# Patient Record
Sex: Male | Born: 1955 | Race: Black or African American | Hispanic: No | Marital: Married | Smoking: Former smoker
Health system: Southern US, Community
[De-identification: ages and names within clinical notes are randomized; demographics above are authoritative.]

## PROBLEM LIST (undated history)

## (undated) DIAGNOSIS — J45909 Unspecified asthma, uncomplicated: Secondary | ICD-10-CM

## (undated) DIAGNOSIS — C61 Malignant neoplasm of prostate: Secondary | ICD-10-CM

## (undated) DIAGNOSIS — N401 Enlarged prostate with lower urinary tract symptoms: Secondary | ICD-10-CM

## (undated) DIAGNOSIS — N529 Male erectile dysfunction, unspecified: Secondary | ICD-10-CM

## (undated) DIAGNOSIS — E7800 Pure hypercholesterolemia, unspecified: Secondary | ICD-10-CM

## (undated) DIAGNOSIS — J439 Emphysema, unspecified: Secondary | ICD-10-CM

## (undated) HISTORY — PX: COLONOSCOPY: SHX174

## (undated) HISTORY — PX: PROSTATE BIOPSY: SHX241

## (undated) HISTORY — PX: OTHER SURGICAL HISTORY: SHX169

---

## 2011-07-20 ENCOUNTER — Ambulatory Visit (HOSPITAL_BASED_OUTPATIENT_CLINIC_OR_DEPARTMENT_OTHER)
Admission: EM | Admit: 2011-07-20 | Discharge: 2011-07-21 | Disposition: A | Discharge status: Home / Self Care | Payer: Worker's Compensation | Attending: Emergency Medicine | Admitting: Emergency Medicine

## 2011-07-20 ENCOUNTER — Emergency Department (INDEPENDENT_AMBULATORY_CARE_PROVIDER_SITE_OTHER): Payer: Worker's Compensation

## 2011-07-20 ENCOUNTER — Encounter: Payer: Self-pay | Admitting: *Deleted

## 2011-07-20 DIAGNOSIS — X58XXXA Exposure to other specified factors, initial encounter: Secondary | ICD-10-CM | POA: Insufficient documentation

## 2011-07-20 DIAGNOSIS — S62233B Other displaced fracture of base of first metacarpal bone, unspecified hand, initial encounter for open fracture: Secondary | ICD-10-CM

## 2011-07-20 DIAGNOSIS — Y9269 Other specified industrial and construction area as the place of occurrence of the external cause: Secondary | ICD-10-CM | POA: Insufficient documentation

## 2011-07-20 DIAGNOSIS — M25549 Pain in joints of unspecified hand: Secondary | ICD-10-CM

## 2011-07-20 DIAGNOSIS — Y990 Civilian activity done for income or pay: Secondary | ICD-10-CM | POA: Insufficient documentation

## 2011-07-20 DIAGNOSIS — S62209B Unspecified fracture of first metacarpal bone, unspecified hand, initial encounter for open fracture: Secondary | ICD-10-CM

## 2011-07-20 DIAGNOSIS — Z23 Encounter for immunization: Secondary | ICD-10-CM | POA: Insufficient documentation

## 2011-07-20 DIAGNOSIS — M7989 Other specified soft tissue disorders: Secondary | ICD-10-CM

## 2011-07-20 DIAGNOSIS — S61209A Unspecified open wound of unspecified finger without damage to nail, initial encounter: Secondary | ICD-10-CM | POA: Insufficient documentation

## 2011-07-20 MED ORDER — TETANUS-DIPHTH-ACELL PERTUSSIS 5-2.5-18.5 LF-MCG/0.5 IM SUSP
0.5000 mL | Freq: Once | INTRAMUSCULAR | Status: AC
  Administered 2011-07-20: 0.5 mL via INTRAMUSCULAR
  Filled 2011-07-20: qty 0.5

## 2011-07-20 MED ORDER — CEFAZOLIN SODIUM 1-5 GM-% IV SOLN
1.0000 g | Freq: Once | INTRAVENOUS | Status: AC
  Administered 2011-07-20: 1 g via INTRAVENOUS
  Filled 2011-07-20: qty 50

## 2011-07-20 MED ORDER — MORPHINE SULFATE 2 MG/ML IJ SOLN
INTRAMUSCULAR | Status: AC
  Filled 2011-07-20: qty 1

## 2011-07-20 MED ORDER — MORPHINE SULFATE 4 MG/ML IJ SOLN
6.0000 mg | Freq: Once | INTRAMUSCULAR | Status: AC
  Administered 2011-07-20: 6 mg via INTRAVENOUS
  Filled 2011-07-20: qty 1

## 2011-07-20 NOTE — ED Notes (Signed)
Pt sent here from [facility] for further eval, left hand injury at work with laceration.

## 2011-07-20 NOTE — ED Provider Notes (Signed)
History     CSN: [id number] Arrival date & time: 07/20/2011  6:30 PM   First MD Initiated Contact with Patient 07/20/11 1832      Chief Complaint  Patient presents with  . Hand Injury    (Consider location/radiation/quality/duration/timing/severity/associated sxs/prior treatment) HPI Patient was at work this evening when he had a loading dock ramp dropped on his left hand, lacerating the area between his thumb and second digit.  He went to [facility] where he was advised that he would need to come to the emergency room and have orthopedic hand surgeon consult.  Patient has range of motion of the thumb , but there is some issue with full range of motion.  There is some extensive bleeding to the wound appears to be muscle exposed.  Patient states there is some mild numbness of the digit. History reviewed. No pertinent past medical history.  History reviewed. No pertinent past surgical history.  History reviewed. No pertinent family history.  History  Substance Use Topics  . Smoking status: Current Everyday Smoker -- 0.5 packs/day  . Smokeless tobacco: Not on file  . Alcohol Use: No      Review of Systems All pertinent positives and negatives are reviewed in the history of present illness. Allergies  Review of patient's allergies indicates no known allergies.  Home Medications   Current Outpatient Rx  Name Route Sig Dispense Refill  . NAPHAZOLINE HCL 0.012 % OP SOLN Both Eyes Place 1-2 drops into both eyes 2 (two) times daily as needed. For eye irritation       BP 137/77  Pulse 68  Temp 97.7 F (36.5 C)  Resp 16  Ht 5\' 7"  (1.702 m)  Wt 170 lb (77.111 kg)  BMI 26.63 kg/m2  SpO2 96%  Physical Exam  Constitutional: He is oriented to person, place, and time. He appears well-developed and well-nourished.  HENT:  Head: Normocephalic and atraumatic.  Cardiovascular: Normal rate, regular rhythm and normal heart sounds.   Pulmonary/Chest: Effort normal and breath sounds  normal.  Musculoskeletal:       Hands: Neurological: He is alert and oriented to person, place, and time.    ED Course  Procedures (including critical care time)  Labs Reviewed - No data to display Dg Hand Complete Left  07/20/2011  *RADIOLOGY REPORT*  Clinical Data: Blunt force injury, pain to thumb.  LEFT HAND - COMPLETE 3+ VIEW  Comparison: None.  Findings: Fracture dislocation, mildly comminuted, at the base of the first metacarpal.  Soft tissue swelling.  Air is seen in the soft tissues. First Carpometacarpal articulation grossly intact.  IMPRESSION: Mildly comminuted fracture dislocation at the base of the first metacarpal.  Soft tissue swelling.  Air in the soft tissues.  Original Report Authenticated By: Elsie Stain, M.D.     No diagnosis found.  I spoke with Dr. Betha Loa about the patient and he advised to have him come to the [facility] long emergency department and remained n.p.o.Marland Kitchen patient is being given 1 g of Ancef and pain control  MDM  Open fracture with muscle exposure noted to be left thenar prominence, and into the web space.          Jamesetta Orleans [place], [place] 07/20/11 2026

## 2011-07-20 NOTE — ED Provider Notes (Signed)
Medical screening examination/treatment/procedure(s) were performed by non-physician practitioner and as supervising physician I was immediately available for consultation/collaboration.  Toy Baker, MD 07/20/11 [phone]

## 2011-07-20 NOTE — ED Notes (Signed)
Report received on pt from [facility].

## 2011-07-21 ENCOUNTER — Encounter (HOSPITAL_COMMUNITY)
Admission: EM | Disposition: A | Discharge status: Home / Self Care | Payer: Self-pay | Source: Home / Self Care | Attending: Emergency Medicine

## 2011-07-21 ENCOUNTER — Encounter (HOSPITAL_COMMUNITY): Payer: Self-pay | Admitting: Anesthesiology

## 2011-07-21 ENCOUNTER — Emergency Department (HOSPITAL_COMMUNITY): Payer: Worker's Compensation | Admitting: Anesthesiology

## 2011-07-21 ENCOUNTER — Encounter (HOSPITAL_COMMUNITY): Payer: Self-pay | Admitting: Orthopedic Surgery

## 2011-07-21 HISTORY — PX: ORIF FINGER FRACTURE: SHX2122

## 2011-07-21 SURGERY — OPEN REDUCTION INTERNAL FIXATION (ORIF) METACARPAL (FINGER) FRACTURE
Anesthesia: General | Site: Hand | Laterality: Left | Wound class: Dirty or Infected

## 2011-07-21 MED ORDER — SULFAMETHOXAZOLE-TRIMETHOPRIM 800-160 MG PO TABS: 1.0000 | ORAL_TABLET | Freq: Two times a day (BID) | ORAL | Status: AC

## 2011-07-21 MED ORDER — SULFAMETHOXAZOLE-TRIMETHOPRIM 800-160 MG PO TABS: 1.0000 | ORAL_TABLET | Freq: Two times a day (BID) | ORAL | Status: DC

## 2011-07-21 MED ORDER — PROPOFOL 10 MG/ML IV EMUL
INTRAVENOUS | Status: DC | PRN
  Administered 2011-07-21: 140 mg via INTRAVENOUS

## 2011-07-21 MED ORDER — BUPIVACAINE HCL (PF) 0.25 % IJ SOLN
INTRAMUSCULAR | Status: DC | PRN
  Administered 2011-07-21: 10 mL

## 2011-07-21 MED ORDER — MIDAZOLAM HCL 5 MG/5ML IJ SOLN
INTRAMUSCULAR | Status: DC | PRN
  Administered 2011-07-21: 2 mg via INTRAVENOUS

## 2011-07-21 MED ORDER — PROMETHAZINE HCL 25 MG/ML IJ SOLN: 6.2500 mg | INTRAMUSCULAR | Status: DC | PRN

## 2011-07-21 MED ORDER — CEFAZOLIN SODIUM 1-5 GM-% IV SOLN
1.0000 g | Freq: Once | INTRAVENOUS | Status: AC
  Administered 2011-07-21: 1 g via INTRAVENOUS

## 2011-07-21 MED ORDER — SUCCINYLCHOLINE CHLORIDE 20 MG/ML IJ SOLN
INTRAMUSCULAR | Status: DC | PRN
  Administered 2011-07-21: 100 mg via INTRAVENOUS

## 2011-07-21 MED ORDER — DEXAMETHASONE SODIUM PHOSPHATE 4 MG/ML IJ SOLN
INTRAMUSCULAR | Status: DC | PRN
  Administered 2011-07-21: 4 mg via INTRAVENOUS

## 2011-07-21 MED ORDER — OXYCODONE-ACETAMINOPHEN 5-325 MG PO TABS: ORAL_TABLET | ORAL | Status: DC

## 2011-07-21 MED ORDER — MORPHINE SULFATE 2 MG/ML IJ SOLN
2.0000 mg | Freq: Once | INTRAMUSCULAR | Status: AC
  Administered 2011-07-21: 2 mg via INTRAVENOUS
  Filled 2011-07-21: qty 1

## 2011-07-21 MED ORDER — SUFENTANIL CITRATE 50 MCG/ML IV SOLN
INTRAVENOUS | Status: DC | PRN
  Administered 2011-07-21: 20 ug via INTRAVENOUS
  Administered 2011-07-21: 10 ug via INTRAVENOUS

## 2011-07-21 MED ORDER — LACTATED RINGERS IV SOLN
INTRAVENOUS | Status: DC | PRN
  Administered 2011-07-21 (×2): via INTRAVENOUS

## 2011-07-21 MED ORDER — OXYCODONE-ACETAMINOPHEN 5-325 MG PO TABS: ORAL_TABLET | ORAL | Status: AC

## 2011-07-21 MED ORDER — ONDANSETRON HCL 4 MG/2ML IJ SOLN
INTRAMUSCULAR | Status: DC | PRN
  Administered 2011-07-21: 4 mg via INTRAVENOUS

## 2011-07-21 MED ORDER — GLYCOPYRROLATE 0.2 MG/ML IJ SOLN
INTRAMUSCULAR | Status: DC | PRN
  Administered 2011-07-21: 0.2 mg via INTRAVENOUS

## 2011-07-21 MED ORDER — HYDROMORPHONE HCL PF 1 MG/ML IJ SOLN: 0.2500 mg | INTRAMUSCULAR | Status: DC | PRN

## 2011-07-21 SURGICAL SUPPLY — 43 items
BANDAGE ELASTIC 4 VELCRO ST LF (GAUZE/BANDAGES/DRESSINGS) ×2 IMPLANT
BANDAGE GAUZE ELAST BULKY 4 IN (GAUZE/BANDAGES/DRESSINGS) IMPLANT
BNDG ESMARK 4X9 LF (GAUZE/BANDAGES/DRESSINGS) ×2 IMPLANT
CLOTH BEACON ORANGE TIMEOUT ST (SAFETY) ×2 IMPLANT
CORDS BIPOLAR (ELECTRODE) ×2 IMPLANT
DRAPE SURG 17X23 STRL (DRAPES) ×2 IMPLANT
DRSG ADAPTIC 3X8 NADH LF (GAUZE/BANDAGES/DRESSINGS) IMPLANT
DRSG EMULSION OIL 3X3 NADH (GAUZE/BANDAGES/DRESSINGS) IMPLANT
DRSG PAD ABDOMINAL 8X10 ST (GAUZE/BANDAGES/DRESSINGS) IMPLANT
GAUZE KERLIX 2  STERILE LF (GAUZE/BANDAGES/DRESSINGS) ×2 IMPLANT
GAUZE XEROFORM 1X8 LF (GAUZE/BANDAGES/DRESSINGS) ×2 IMPLANT
GLOVE BIO SURGEON STRL SZ7.5 (GLOVE) ×2 IMPLANT
GLOVE BIOGEL PI IND STRL 8 (GLOVE) ×1 IMPLANT
GLOVE BIOGEL PI INDICATOR 8 (GLOVE) ×1
GOWN STRL REIN XL XLG (GOWN DISPOSABLE) ×2 IMPLANT
HANDPIECE INTERPULSE COAX TIP (DISPOSABLE)
K-WIRE .045 ×6 IMPLANT
KIT BASIN OR (CUSTOM PROCEDURE TRAY) ×2 IMPLANT
KIT ROOM TURNOVER OR (KITS) ×2 IMPLANT
MANIFOLD NEPTUNE II (INSTRUMENTS) ×2 IMPLANT
NEEDLE HYPO 25GX1X1/2 BEV (NEEDLE) ×2 IMPLANT
NS IRRIG 1000ML POUR BTL (IV SOLUTION) ×2 IMPLANT
PACK ORTHO EXTREMITY (CUSTOM PROCEDURE TRAY) ×2 IMPLANT
PAD ARMBOARD 7.5X6 YLW CONV (MISCELLANEOUS) ×4 IMPLANT
PADDING WEBRIL 4 STERILE (GAUZE/BANDAGES/DRESSINGS) ×2 IMPLANT
SET HNDPC FAN SPRY TIP SCT (DISPOSABLE) IMPLANT
SPLINT PLASTER 3X15 (CAST SUPPLIES) ×2 IMPLANT
SPONGE GAUZE 4X4 12PLY (GAUZE/BANDAGES/DRESSINGS) ×2 IMPLANT
SPONGE LAP 18X18 X RAY DECT (DISPOSABLE) IMPLANT
SPONGE LAP 4X18 X RAY DECT (DISPOSABLE) ×2 IMPLANT
SUT ETHILON 4 0 PS 2 18 (SUTURE) ×4 IMPLANT
SUT VIC AB 3-0 SH 27 (SUTURE) ×1
SUT VIC AB 3-0 SH 27X BRD (SUTURE) ×1 IMPLANT
SWAB CULTURE LIQ STUART DBL (MISCELLANEOUS) IMPLANT
SYR CONTROL 10ML LL (SYRINGE) ×2 IMPLANT
TOWEL OR 17X24 6PK STRL BLUE (TOWEL DISPOSABLE) ×2 IMPLANT
TOWEL OR 17X26 10 PK STRL BLUE (TOWEL DISPOSABLE) ×2 IMPLANT
TUBE ANAEROBIC SPECIMEN COL (MISCELLANEOUS) IMPLANT
TUBE CONNECTING 12X1/4 (SUCTIONS) ×2 IMPLANT
TUBE FEEDING 5FR 15 INCH (TUBING) IMPLANT
UNDERPAD 30X30 INCONTINENT (UNDERPADS AND DIAPERS) ×2 IMPLANT
WATER STERILE IRR 1000ML POUR (IV SOLUTION) ×2 IMPLANT
YANKAUER SUCT BULB TIP NO VENT (SUCTIONS) IMPLANT

## 2011-07-21 NOTE — ED Notes (Signed)
Admitting MD in to speak with pt at this time.  

## 2011-07-21 NOTE — Op Note (Signed)
Dictation [id number]

## 2011-07-21 NOTE — Anesthesia Postprocedure Evaluation (Signed)
  Anesthesia Post-op Note  Patient: James Gutierrez  Procedure(s) Performed:  OPEN REDUCTION INTERNAL FIXATION (ORIF) METACARPAL (FINGER) FRACTURE - left thumb laceration   Patient Location: PACU  Anesthesia Type: General  Level of Consciousness: awake, alert , oriented and patient cooperative  Airway and Oxygen Therapy: Patient Spontanous Breathing and Patient connected to nasal cannula oxygen  Post-op Pain: none  Post-op Assessment: Post-op Vital signs reviewed, Patient's Cardiovascular Status Stable, Respiratory Function Stable, Patent Airway, No signs of Nausea or vomiting and Pain level controlled  Post-op Vital Signs: Reviewed and stable  Complications: No apparent anesthesia complications

## 2011-07-21 NOTE — ED Provider Notes (Cosign Needed)
Patient transferred from [place] to see Dr. Merlyn Lot for evaluation of hand injury.  Patient resting comfortably at present.  Dr. Merlyn Lot  notified of patient's arrival, is currently at bedside.  Jimmye Norman, NP 07/21/11 [phone]

## 2011-07-21 NOTE — Anesthesia Postprocedure Evaluation (Signed)
  Anesthesia Post-op Note  Patient: James Gutierrez  Procedure(s) Performed:  OPEN REDUCTION INTERNAL FIXATION (ORIF) METACARPAL (FINGER) FRACTURE - left thumb laceration   Patient Location: PACU  Anesthesia Type: General  Level of Consciousness: awake, alert  and oriented  Airway and Oxygen Therapy: Patient Spontanous Breathing  Post-op Pain: mild  Post-op Assessment: Post-op Vital signs reviewed, Patient's Cardiovascular Status Stable, Respiratory Function Stable, Patent Airway, No signs of Nausea or vomiting and Pain level controlled   Post-op Vital Signs: stable  Complications: No apparent anesthesia complications

## 2011-07-21 NOTE — Transfer of Care (Signed)
Immediate Anesthesia Transfer of Care Note  Patient: James Gutierrez  Procedure(s) Performed:  OPEN REDUCTION INTERNAL FIXATION (ORIF) METACARPAL (FINGER) FRACTURE - left thumb laceration   Patient Location: PACU  Anesthesia Type: General  Level of Consciousness: awake, sedated and patient cooperative  Airway & Oxygen Therapy: Patient Spontanous Breathing and Patient connected to nasal cannula oxygen  Post-op Assessment: Report given to PACU RN, Post -op Vital signs reviewed and stable and Patient moving all extremities  Post vital signs: Reviewed and stable  Complications: No apparent anesthesia complications

## 2011-07-21 NOTE — Brief Op Note (Signed)
07/20/2011 - 07/21/2011  4:22 AM  PATIENT:  James Gutierrez  55 y.o. male  PRE-OPERATIVE DIAGNOSIS:  fracture and laceration of left thumb  POST-OPERATIVE DIAGNOSIS:  fracture and laceration of left thumb  PROCEDURE:  Procedure(s): OPEN REDUCTION INTERNAL FIXATION (ORIF) METACARPAL (FINGER) FRACTURE  SURGEON:  Surgeon(s): Tami Ribas  PHYSICIAN ASSISTANT:   ASSISTANTS: none   ANESTHESIA:   general  EBL:  Total I/O In: 1000 [I.V.:1000] Out: -   BLOOD ADMINISTERED:none  DRAINS: none   LOCAL MEDICATIONS USED:  MARCAINE 10CC  SPECIMEN:  No Specimen  DISPOSITION OF SPECIMEN:  N/A  COUNTS:  YES  TOURNIQUET:   Total Tourniquet Time Documented: Upper Arm (Left) - 105 minutes  DICTATION: .Other Dictation: Dictation Number [id number]  PLAN OF CARE: Discharge to home after PACU  PATIENT DISPOSITION:  PACU - hemodynamically stable.

## 2011-07-21 NOTE — H&P (Signed)
James Gutierrez is an 55 y.o. male.   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  He suffered a laceration in the thumb index webspace.  He was taken to [facility] where radiographs revealed a thumb metacarpal base fracture.  He reports no previous injuries to the left hand and no other injuries at this time with the exception of a sore great toe.  History reviewed. No pertinent past medical history.  History reviewed. No pertinent past surgical history.  History reviewed. No pertinent family history. Social History:  reports that he has been smoking.  He does not have any smokeless tobacco history on file. He reports that he does not drink alcohol or use illicit drugs.  Allergies: No Known Allergies  Medications Prior to Admission  Medication Dose Route Frequency Provider Last Rate Last Dose  . ceFAZolin (ANCEF) IVPB 1 g/50 mL premix  1 g Intravenous Once Jamesetta Orleans [place], PA   1 g at 07/20/11 2004  . morphine 2 MG/ML injection 2 mg  2 mg Intravenous Once Tami Ribas   2 mg at 07/21/11 0030  . morphine 2 MG/ML injection           . morphine 4 MG/ML injection 6 mg  6 mg Intravenous Once Carlyle Dolly, PA   6 mg at 07/20/11 2023  . TDaP (BOOSTRIX) injection 0.5 mL  0.5 mL Intramuscular Once Carlyle Dolly, PA   0.5 mL at 07/20/11 2002   No current outpatient prescriptions on file as of 07/20/2011.    No results found for this or any previous visit (from the past 48 hour(s)).  Dg Hand Complete Left  07/20/2011  *RADIOLOGY REPORT*  Clinical Data: Blunt force injury, pain to thumb.  LEFT HAND - COMPLETE 3+ VIEW  Comparison: None.  Findings: Fracture dislocation, mildly comminuted, at the base of the first metacarpal.  Soft tissue swelling.  Air is seen in the soft tissues. First Carpometacarpal articulation grossly intact.  IMPRESSION: Mildly comminuted  fracture dislocation at the base of the first metacarpal.  Soft tissue swelling.  Air in the soft tissues.  Original Report Authenticated By: Elsie Stain, M.D.     A comprehensive review of systems was negative.  Blood pressure 131/77, pulse 82, temperature 98.5 F (36.9 C), temperature source Oral, resp. rate 20, height 5\' 7"  (1.702 m), weight 77.111 kg (170 lb), SpO2 100.00%.  General appearance: alert, cooperative, appears stated age and no distress Head: Normocephalic, without obvious abnormality, atraumatic Neck: supple, symmetrical, trachea midline Resp: clear to auscultation bilaterally Cardio: regular rate and rhythm GI: soft, non-tender; bowel sounds normal; no masses,  no organomegaly Extremities: right upper extremity ltsi, +epl/fpl/io, + cap refill.  left upper extremity: intact to light touch sensation and capillary refill in all fingertips.  +epl/fpl/io.  there is a wound in the distal aspect of the thumb/index webspace with lacerated muscle protruding.  He has inability to adduct the thumb to the index finger. No gross contamination.  There is normal sensation in the thumb and brisk capillary refill.    Pulses: 2+ and symmetric Skin: Skin color, texture, turgor normal. No rashes or lesions or wound as above Neurologic: Grossly normal Incision/Wound: Wound as above.  Assessment/Plan Left thumb metacarpal base fracture and thumb/index webspace laceration.  Recommend OR for I&D, possible muscle repair, percutaneous pinning of metacarpal fracture.  Risks, benefits, alternatives discussed with patient and wife and they agree with plan of care.  Siddiq Kaluzny R 07/21/2011, 12:42 AM

## 2011-07-21 NOTE — Anesthesia Preprocedure Evaluation (Addendum)
Anesthesia Evaluation  Patient identified by MRN, date of birth, ID band Patient awake    Reviewed: Allergy & Precautions, H&P , NPO status , Patient's Chart, lab work & pertinent test results  History of Anesthesia Complications Negative for: history of anesthetic complications  Airway       Dental  (+) Poor Dentition and Dental Advisory Given   Pulmonary Current Smoker,          Cardiovascular     Neuro/Psych Negative Neurological ROS  Negative Psych ROS   GI/Hepatic negative GI ROS, Neg liver ROS,   Endo/Other  Negative Endocrine ROS  Renal/GU negative Renal ROS     Musculoskeletal negative musculoskeletal ROS (+)   Abdominal   Peds  Hematology negative hematology ROS (+)   Anesthesia Other Findings   Reproductive/Obstetrics                          Anesthesia Physical Anesthesia Plan  ASA: II and Emergent  Anesthesia Plan: General   Post-op Pain Management:    Induction: Intravenous and Rapid sequence  Airway Management Planned: Oral ETT  Additional Equipment:   Intra-op Plan:   Post-operative Plan: Extubation in OR  Informed Consent: I have reviewed the patients History and Physical, chart, labs and discussed the procedure including the risks, benefits and alternatives for the proposed anesthesia with the patient or authorized representative who has indicated his/her understanding and acceptance.   Dental advisory given  Plan Discussed with: Surgeon and CRNA  Anesthesia Plan Comments:        Anesthesia Quick Evaluation

## 2011-07-21 NOTE — Progress Notes (Signed)
Pt d/c home via wheelchair with wife

## 2011-07-21 NOTE — Anesthesia Postprocedure Evaluation (Signed)
  Anesthesia Post-op Note  Patient: James Gutierrez  Procedure(s) Performed:  OPEN REDUCTION INTERNAL FIXATION (ORIF) METACARPAL (FINGER) FRACTURE - left thumb laceration   Patient Location: PACU  Anesthesia Type: General  Level of Consciousness: awake, oriented and patient cooperative  Airway and Oxygen Therapy: Patient Spontanous Breathing and Patient connected to nasal cannula oxygen  Post-op Pain: none  Post-op Assessment: Post-op Vital signs reviewed, Patient's Cardiovascular Status Stable, Respiratory Function Stable, Patent Airway, No signs of Nausea or vomiting and Pain level controlled  Post-op Vital Signs: Reviewed and stable  Complications: No apparent anesthesia complications

## 2011-07-21 NOTE — Op Note (Signed)
NAME:  XZAIVER, VAYDA                  ACCOUNT NO.:  MEDICAL RECORD NO.:  [id number]  LOCATION:                                 FACILITY:  PHYSICIAN:  Betha Loa, MD        DATE OF BIRTH:  Sep 14, 1955  DATE OF PROCEDURE:  07/21/2011 DATE OF DISCHARGE:                              OPERATIVE REPORT   PREOPERATIVE DIAGNOSIS:  Left thumb metacarpal base fracture and thumb index webspace laceration with muscle injury.  POSTOPERATIVE DIAGNOSIS:  Left thumb metacarpal base fracture, CMC dislocation, and thumb index webspace laceration with muscle injury.  PROCEDURE:  Irrigation and debridement of open fracture, exploration of traumatic wound, and open reduction percutaneous pinning of left thumb epibasal fracture and CMC dislocation.  SURGEON:  Betha Loa, MD  ASSISTANTS:  None.  ANESTHESIA:  General.  IV FLUIDS:  Per anesthesia flow sheet.  ESTIMATED BLOOD LOSS:  Minimal.  COMPLICATIONS:  None.  SPECIMENS:  None.  TOURNIQUET TIME:  105 minutes.  DISPOSITION:  Stable to PACU.  INDICATIONS:  Mr. Bynum is a 55 year old male who was at work yesterday in the early evening when some type of steel door or lift hit his left hand.  This caused injury and laceration to the hand.  He was taken to [facility] where radiographs were taken revealing epi- basal fracture of the metacarpal of the left thumb.  He was referred to me for further care.  He was transferred to [facility] for my evaluation.  On evaluation, he had intact sensation and good capillary refill in all fingertips.  He could flex, extend the IP joint of his thumb slightly and could wiggle all of his digits.  There was laceration in the thumb index webspace along the border of the web. On radiographs, he had an epi-basal fracture with 100% displacement of the fracture.  I discussed with Mr. Rahming and his wife the nature of the injury.  I recommended going to the operating room for  irrigation and debridement of the wounds, pinning of the fracture, and potential repair of muscle or other associated injuries.  Risks, benefits, alternatives of surgery were discussed including the risk of blood loss, infection, damage to nerves, vessels, tendons, ligaments, bone, failure to surgery, need for additional surgery, complications with wound healing, continued pain, nonunion, malunion, stiffness.  He wished understanding of these risks and elected to proceed.  OPERATIVE COURSE:  After being identified preoperatively by myself, the Patient & I agreed upon procedure and site procedure.  The surgical site was marked.  The risks, benefits, and alternatives of surgery were reviewed and he wished to proceed.  Surgical consent had been signed.  His Ancef was redosed.  He was transferred to the operating room and placed on the operating room table in supine position with left upper extremity on arm board.  General esthesia was induced by the anesthesiologist. Left upper extremity was prepped and draped in normal sterile orthopedic fashion.  A surgical pause was performed between surgeons, anesthesia, operating staff, and all were in agreement with the patient, procedure, and site of procedure.  Tourniquet at the proximal aspect of the extremity  was inflated to 250 mmHg after exsanguination of limb with an Esmarch bandage.  His wound was copiously irrigated with 500 mL of sterile saline.  There was no gross contamination.  It was extended on the dorsal side of the hand.  The wound was explored.  There was an arterial branch it was lacerated, but seemed to be an accessory branch.  The digital artery to the ulnar side of the thumb and the digital artery to the radial side of the index finger were identified and were intact. The ulnar digital nerve to the thumb and radial digital nerve to the index finger were identified and were intact as well.  There was significant muscle tearing of  the thumb adductor muscle.  The fracture could be palpated through his wound.  There was also small poke wound at the dorsal aspect of the thumb.  The wound was again copiously irrigated with sterile saline.  A single 3-0 Vicryl stitch was able to be placed in the muscle to re-oppose it.  The fracture was able to be reduced by direct manipulation through the wound.  A 0.045-inch K-wire was then advanced from the shaft of the thumb metacarpal across the fracture site into the trapezium.  The [facility] joint was noted to be unstable and would significantly subluxate.  This was reduced and secured with the pin as well.  An additional 0.045-inch K-wire was advanced through the base of the thumb fracture into the index metacarpal.  An additional 0.045-inch K-wire was advanced distal to the fracture site through the metacarpal of the thumb and into the metacarpal of the index finger.  This stabilized the fracture/dislocation very well.  The C-arm was used in AP, lateral, and oblique projections throughout the case to aid in reduction and positioning of the hardware.  Excellent reduction was obtained.  The wounds were again irrigated.  They were closed with 4-0 nylon in a horizontal mattress and interrupted fashion.  The fracture site and wounds were injected with 10 mL of 0.25% plain Marcaine to aid in postoperative analgesia.  The wounds were dressed with sterile Xeroform and 4x4s.  The pins were bent and cut short.  The hand was wrapped with a Kerlix bandage.  A volar splint and thumb spica splint were placed and wrapped with Kerlix and Ace bandage.  The tourniquet was deflated at 105 minutes.  The fingertips were all pink with brisk capillary refill after deflation of the tourniquet.  The operative drapes were broken down and the patient was awoken from anesthesia safely.  He was transferred back to stretcher and taken to PACU in stable condition.  I will see him back in the office in 1 week  for postoperative followup.  I will give him Percocet 5/325 one to two p.o. q.6 hours p.r.n. pain, dispensed #50 and Bactrim DS 1 p.o. b.i.d. x7 days.     Betha Loa, MD     KK/MEDQ  D:  07/21/2011  T:  07/21/2011  Job:  [id number]

## 2011-07-21 NOTE — Addendum Note (Signed)
Addendum  created 07/21/11 0607 by Nel Stoneking, MD   Modules edited:Anesthesia Attestations    

## 2011-07-21 NOTE — Addendum Note (Signed)
Addendum  created 07/21/11 0553 by Bedelia Person, MD   Modules edited:Anesthesia Attestations, Notes Section

## 2011-07-25 ENCOUNTER — Encounter (HOSPITAL_COMMUNITY): Payer: Self-pay | Admitting: Orthopedic Surgery

## 2012-02-28 IMAGING — CR DG HAND COMPLETE 3+V*L*
3 series · 3 of 3 positions shown · non-contrast
Comparison: None.

CLINICAL DATA: Blunt force injury, pain to thumb.

LEFT HAND - COMPLETE 3+ VIEW

[x hand pa left]
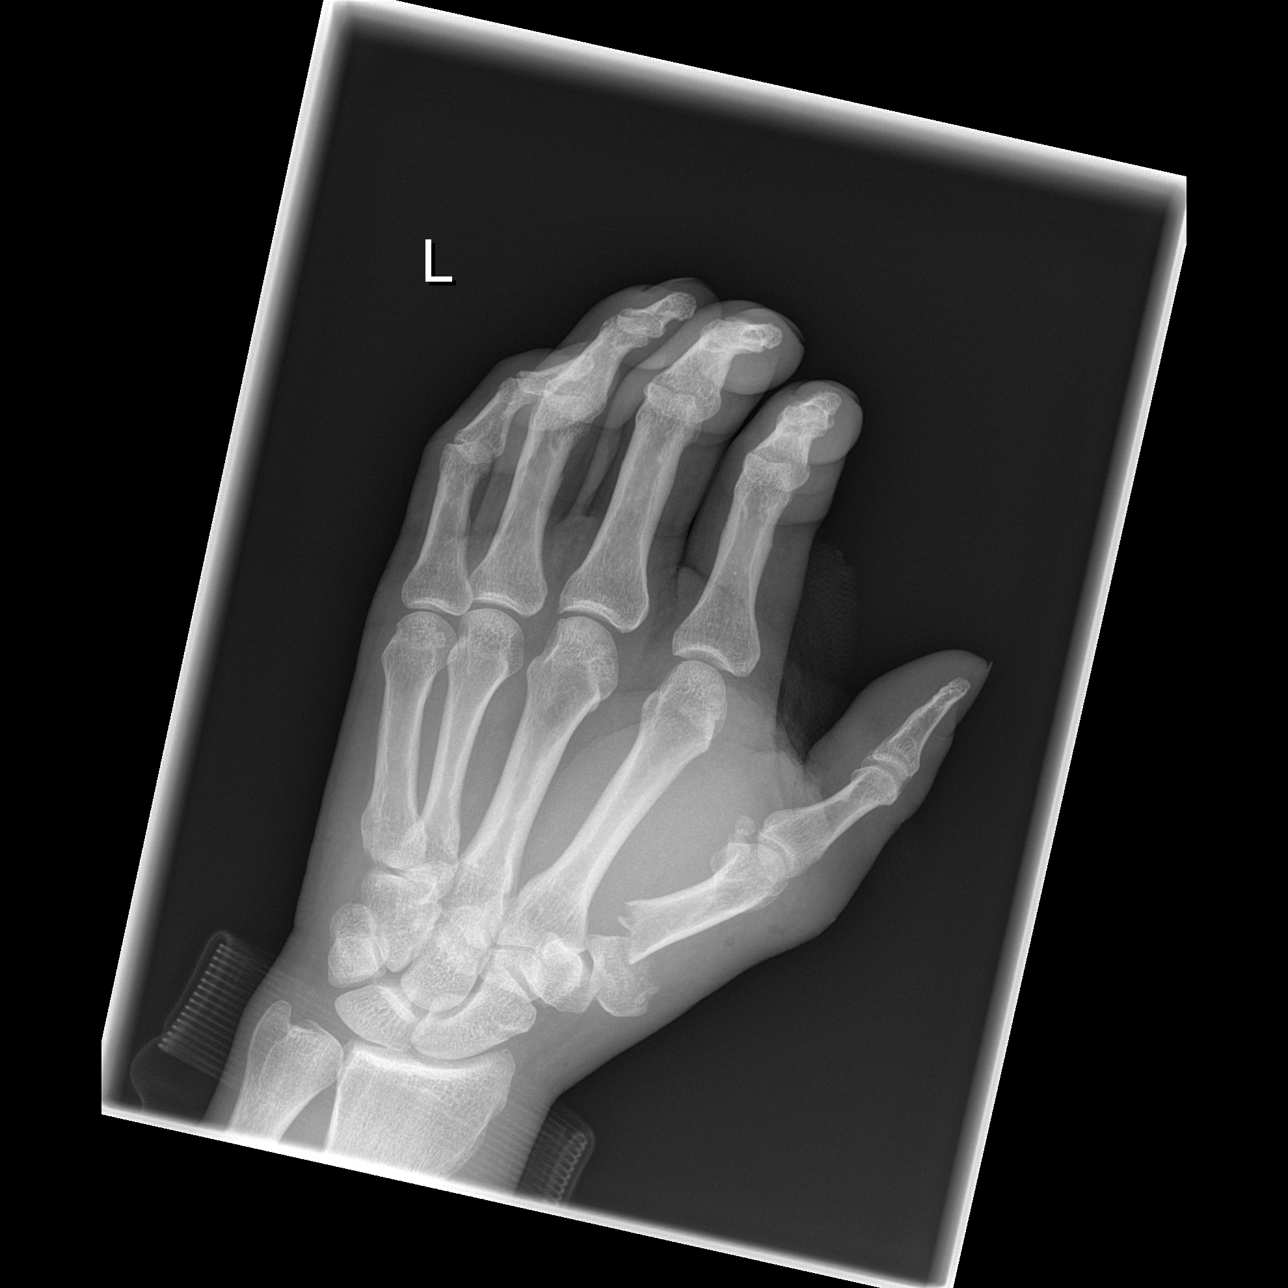

[x hand oblique left]
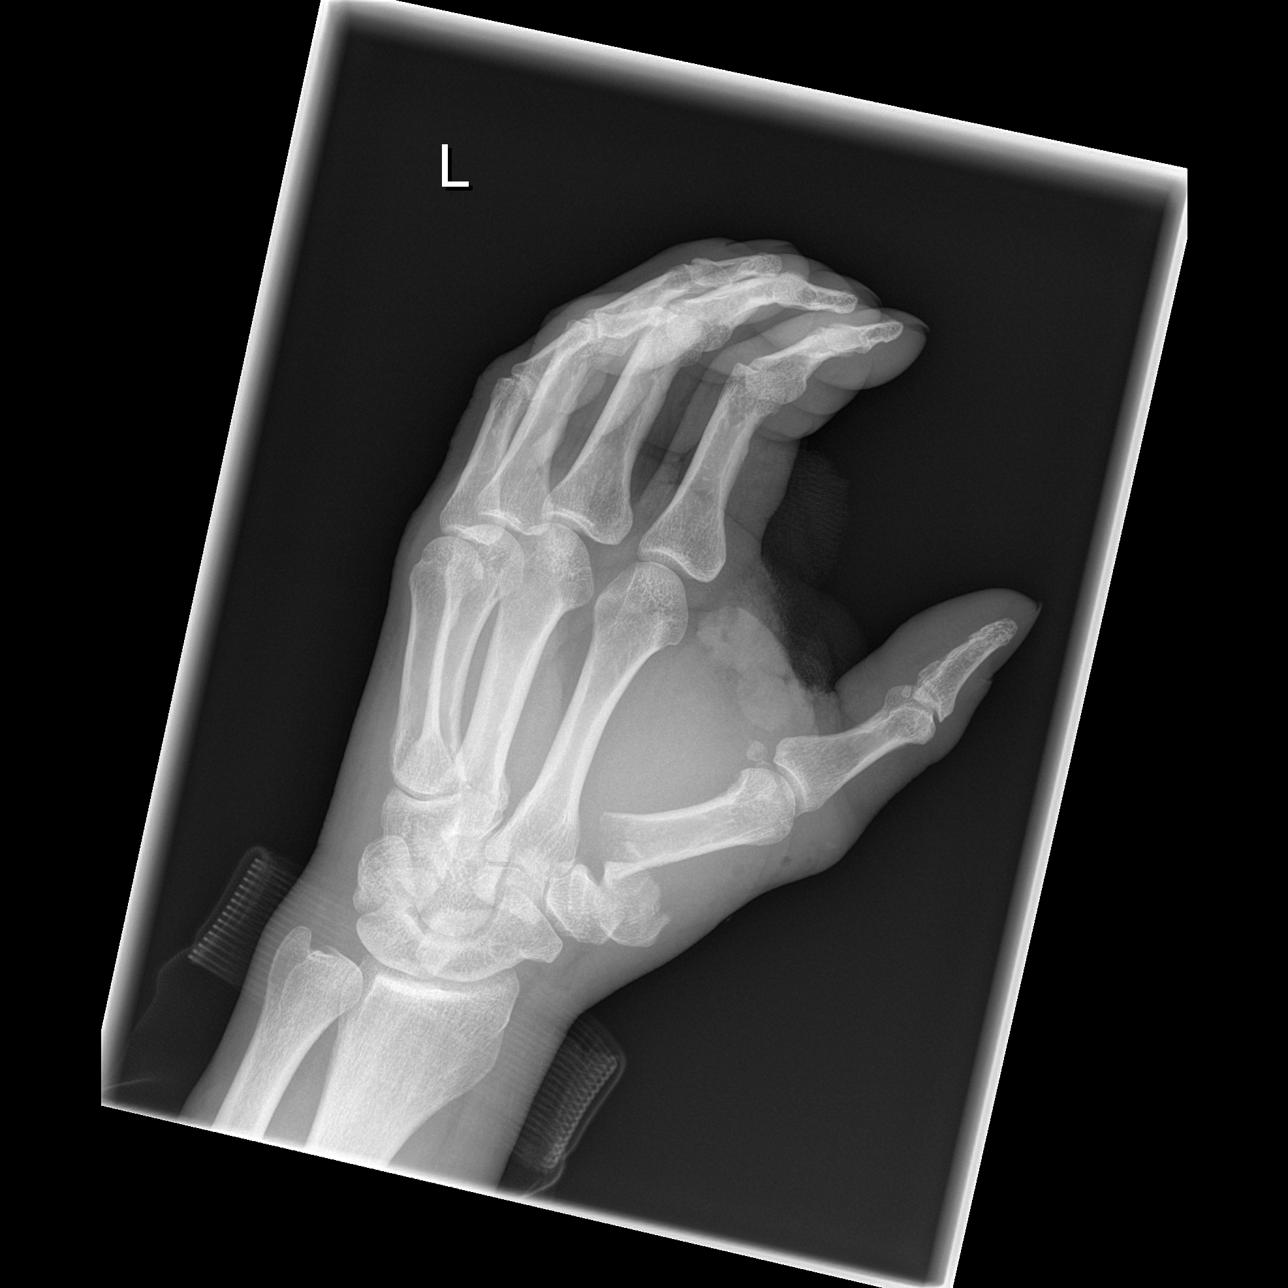

[x hand lat left]
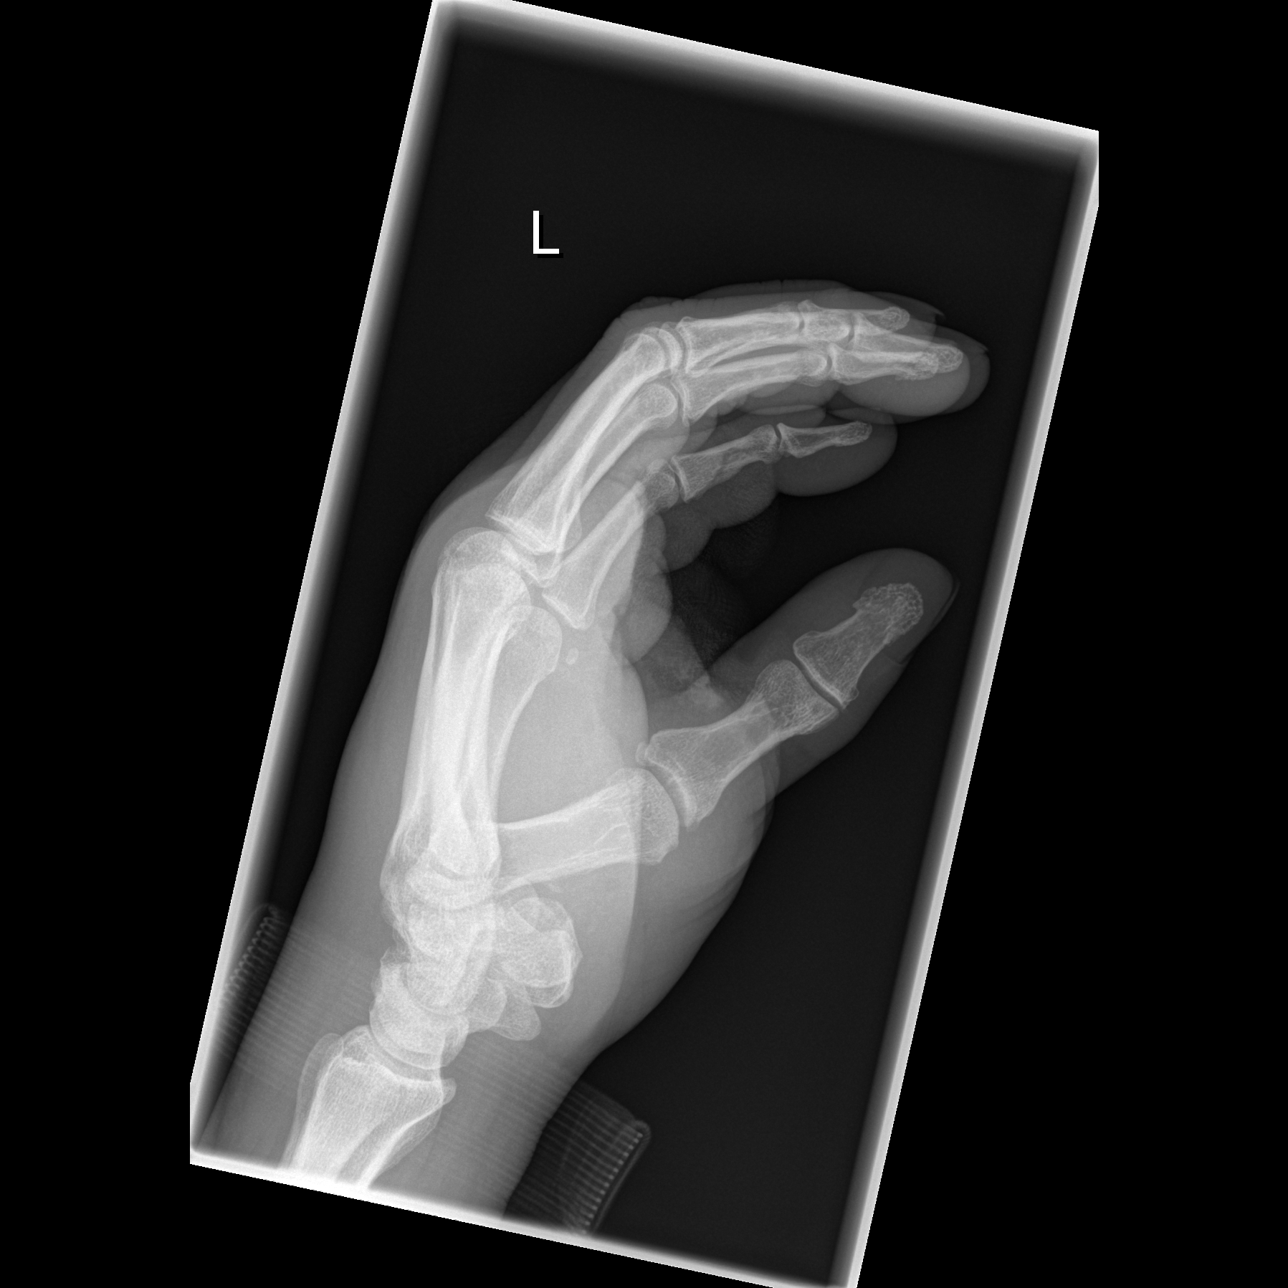

[3 of 3 positions shown; findings below may reference images not displayed]

FINDINGS: Fracture dislocation, mildly comminuted, at the base of
the first metacarpal.  Soft tissue swelling.  Air is seen in the
soft tissues. First Carpometacarpal articulation grossly intact.
IMPRESSION: Mildly comminuted fracture dislocation at the base of the first
metacarpal.  Soft tissue swelling.  Air in the soft tissues.

## 2018-08-26 ENCOUNTER — Other Ambulatory Visit (HOSPITAL_COMMUNITY): Payer: Self-pay | Admitting: Urology

## 2018-08-26 ENCOUNTER — Other Ambulatory Visit: Payer: Self-pay | Admitting: Urology

## 2018-08-26 DIAGNOSIS — C61 Malignant neoplasm of prostate: Secondary | ICD-10-CM

## 2018-09-16 ENCOUNTER — Encounter (HOSPITAL_COMMUNITY)
Admission: RE | Admit: 2018-09-16 | Discharge: 2018-09-16 | Disposition: A | Discharge status: Home / Self Care | Payer: PRIVATE HEALTH INSURANCE | Source: Ambulatory Visit | Attending: Urology | Admitting: Urology

## 2018-09-16 DIAGNOSIS — C61 Malignant neoplasm of prostate: Secondary | ICD-10-CM | POA: Insufficient documentation

## 2018-09-16 MED ORDER — TECHNETIUM TC 99M MEDRONATE IV KIT
20.0000 | PACK | Freq: Once | INTRAVENOUS | Status: AC | PRN
  Administered 2018-09-16: 20.2 via INTRAVENOUS

## 2018-09-27 ENCOUNTER — Telehealth: Payer: Self-pay | Admitting: Medical Oncology

## 2018-09-27 NOTE — Telephone Encounter (Signed)
Mr. Dworkin called asking about the referral for the [facility]. He asked if the clinic is free or will his insurance be billed. I explained we will file wit his insurance.  I  introduced myself as the Prostate Nurse Navigator and the Coordinator of the [facility] [place].  1. I confirmed with the patient he is aware of his referral to the clinic 10/18/18 arriving at 8:00am.  2. I discussed the format of the clinic and the physicians he will be seeing that day.  3. I discussed where the clinic is located and how to contact me.  4. I confirmed his address and informed him I would be mailing a packet of information and forms to be completed. I asked him to bring them with him the day of his appointment.   He voiced understanding of the above. I asked him to call me if he has any questions or concerns regarding his appointments or the forms he needs to complete.

## 2018-10-04 ENCOUNTER — Encounter: Payer: Self-pay | Admitting: Medical Oncology

## 2018-10-15 ENCOUNTER — Encounter: Payer: Self-pay | Admitting: Medical Oncology

## 2018-10-16 ENCOUNTER — Encounter: Payer: Self-pay | Admitting: Radiation Oncology

## 2018-10-16 NOTE — Progress Notes (Signed)
GU Location of Tumor / Histology: prostatic adenocarcinoma  If Prostate Cancer, Gleason Score is (4 + 4) and PSA is (4.49). Prostate volume:23 ml  James Gutierrez was referred by Caryl Ada, NP to Dr. Jeffie Pollock in November 2019 for further evaluation of an elevated PSA. Patient's PSA was noted to be 2.24 in 04/2016.   Biopsies of prostate (if applicable) revealed:    Past/Anticipated interventions by urology, if any: prostate biopsy, CT abd/pelvis (negative), bone scan (negative)  Past/Anticipated interventions by medical oncology, if any: no  Weight changes, if any: no  Bowel/Bladder complaints, if any:    Nausea/Vomiting, if any: no  Pain issues, if any:    SAFETY ISSUES:  Prior radiation?   Pacemaker/ICD?   Possible current pregnancy? no, male patient  Is the patient on methotrexate?   Current Complaints / other details:  63 year old male. Current everyday smoker. Married with 2 children. Paternal GF with hx of prostate ca.

## 2018-10-18 ENCOUNTER — Encounter: Payer: Self-pay | Admitting: General Practice

## 2018-10-18 ENCOUNTER — Encounter: Payer: Self-pay | Admitting: Radiation Oncology

## 2018-10-18 ENCOUNTER — Ambulatory Visit
Admission: RE | Admit: 2018-10-18 | Discharge: 2018-10-18 | Disposition: A | Discharge status: Home / Self Care | Payer: PRIVATE HEALTH INSURANCE | Source: Ambulatory Visit | Attending: Radiation Oncology | Admitting: Radiation Oncology

## 2018-10-18 ENCOUNTER — Encounter: Payer: Self-pay | Admitting: Medical Oncology

## 2018-10-18 ENCOUNTER — Other Ambulatory Visit: Payer: Self-pay

## 2018-10-18 VITALS — BP 143/77 | HR 64 | Temp 98.4°F | Resp 20 | Ht 67.0 in | Wt 168.4 lb

## 2018-10-18 DIAGNOSIS — Z87891 Personal history of nicotine dependence: Secondary | ICD-10-CM | POA: Insufficient documentation

## 2018-10-18 DIAGNOSIS — C61 Malignant neoplasm of prostate: Secondary | ICD-10-CM

## 2018-10-18 HISTORY — DX: Unspecified asthma, uncomplicated: J45.909

## 2018-10-18 HISTORY — DX: Pure hypercholesterolemia, unspecified: E78.00

## 2018-10-18 HISTORY — DX: Malignant neoplasm of prostate: C61

## 2018-10-18 NOTE — Consult Note (Signed)
Multi-Disciplinary Clinic 10/18/2018    James Gutierrez         MRN: [id number]  PRIMARY CARE:  Gaye Alken, NP  DOB: 1956-05-07, 63 year old Male  REFERRING:  Marilynne Drivers, [place]  SSN:   PROVIDER:  Irine Seal, M.D.    TREATING:  Raynelle Bring, M.D.    LOCATION:  [facility] [phone]    CC/HPI: CC: Prostate Cancer   Physician requesting consult: Dr. Irine Seal  PCP: Gaye Alken, NP  Location of consult: [facility] - Prostate Cancer Multidisciplinary Clinic   James Gutierrez is a 63 year old gentleman who was found to have an elevated PSA of 4.49 prompting a TRUS biopsy of the prostate on 08/19/18. 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   Family history: None.   Imaging studies:  Bone scan (09/16/18): Negative for malignancy.  CT abdomen and pelvis (09/16/18): Negative for malignancy. 4 mm hypodense indeterminate lesion of liver.   PMH: He has a history of asthma and GERD.  PSH: No abdominal surgeries.   TNM stage: cT1c N0 M0  PSA: 4.49  Gleason score: 4+4=8  Biopsy (08/19/18): 3/12 cores positive  Left: L lateral apex (10%, 4+4=8), L apex (40%, 4+4=8), L base (1%, 4+4=8)  Right: Benign  Prostate volume: 23.0 cc   Nomogram  OC disease: 34%  EPE: 63%  SVI: 10%  LNI: 11%  PFS (5 year, 10 year): 59%, 44%   Urinary function: IPSS is 14.  Erectile function: SHIM score is 19.     ALLERGIES: No Allergies    MEDICATIONS: Bc 845 mg-65 mg powder in packet  Tylenol 1 PO Daily     GU PSH: Locm 300-399Mg /Ml Iodine,1Ml - 09/16/2018 Prostate Needle Biopsy - 08/19/2018    NON-GU PSH: Hand/finger Surgery Surgical Pathology, Gross And Microscopic Examination For Prostate Needle - 08/19/2018        GU PMH: BPH w/LUTS - 09/24/2018 ED due to arterial insufficiency - 09/24/2018 Prostate Cancer, He has T1c N0 M0 Gleason 8 relatively low volume prostate cancer with  a 28ml prostate, mild to moderate ED and moderate LUTS. I discussed the treatment options with him and don't believe he is a good candidate for AS, Seed monotherapy, cryo or HIFU but a good candidate for RALP or EXRT w/wo seed boost and 2 years of ADT. I am going to try to get him into the [facility] for further discussion of the options. - 09/24/2018 Urinary Urgency - 09/24/2018 Weak Urinary Stream - 09/24/2018 Elevated PSA - 08/19/2018, His PSA has doubled over the last 2 years but his exam is benign. I will repeat the PSA after a week of sexual abstinence and if it remains elevated, he will return for a biopsy. I have reviewed the risks of bleeding, infection and voiding difficulty. , - 07/29/2018    NON-GU PMH: Asthma Hypercholesterolemia    FAMILY HISTORY: Cancer - Father Diabetes - Runs in Family Kidney Stones - Sister Prostate Cancer - Grandfather   SOCIAL HISTORY: Marital Status: Married Preferred Language: English; Race: Black or African American Current Smoking Status: Patient smokes.  <DIV'  Tobacco Use Assessment Completed:  Used Tobacco in last 30 days?   Drinks 1 drink per day.  Drinks 3 caffeinated drinks per day.     Notes: 1 son, 1 daughter    REVIEW OF SYSTEMS:     GU Review Male:  Patient denies frequent urination, hard to postpone urination, burning/ pain with urination, get up at night to urinate, leakage of urine, stream starts and stops, trouble starting your streams, and have to strain to urinate .    Gastrointestinal (Upper):  Patient denies nausea and vomiting.    Gastrointestinal (Lower):  Patient denies diarrhea and constipation.    Constitutional:  Patient denies fever, night sweats, weight loss, and fatigue.    Skin:  Patient denies skin rash/ lesion and itching.    Eyes:  Patient denies blurred vision and double vision.    Ears/ Nose/ Throat:  Patient denies sore throat and sinus problems.    Hematologic/Lymphatic:  Patient denies swollen glands and easy bruising.     Cardiovascular:  Patient denies leg swelling and chest pains.    Respiratory:  Patient denies cough and shortness of breath.    Endocrine:  Patient denies excessive thirst.    Musculoskeletal:  Patient denies back pain and joint pain.    Neurological:  Patient denies headaches and dizziness.    Psychologic:  Patient denies depression and anxiety.    VITAL SIGNS: None     GU PHYSICAL EXAMINATION:      Prostate: Prostate about 40 grams. Left lobe normal consistency, right lobe normal consistency. Symmetrical lobes. No prostate nodule. Left lobe no tenderness, right lobe no tenderness.      MULTI-SYSTEM PHYSICAL EXAMINATION:      Constitutional: Well-nourished. No physical deformities. Normally developed. Good grooming.     Neck: Neck symmetrical, not swollen. Normal tracheal position.     Respiratory: No labored breathing, no use of accessory muscles. Clear bilaterally.     Cardiovascular: Normal temperature, normal extremity pulses, no swelling, no varicosities. Regular rate and rhythm.     Lymphatic: No enlargement of neck, axillae, groin.     Skin: No paleness, no jaundice, no cyanosis. No lesion, no ulcer, no rash.     Neurologic / Psychiatric: Oriented to time, oriented to place, oriented to person. No depression, no anxiety, no agitation.     Gastrointestinal: No mass, no tenderness, no rigidity, non obese abdomen.     Eyes: Normal conjunctivae. Normal eyelids.     Ears, Nose, Mouth, and Throat: Left ear no scars, no lesions, no masses. Right ear no scars, no lesions, no masses. Nose no scars, no lesions, no masses. Normal hearing. Normal lips.     Musculoskeletal: Normal gait and station of head and neck.            PAST DATA REVIEWED:   Source Of History:  Patient  Lab Test Review:  PSA  Records Review:  Pathology Reports, Previous Patient Records  Urine Test Review:  Urinalysis  X-Ray Review: C.T. Abdomen/Pelvis: Reviewed Films.  Bone Scan: Reviewed Films.      08/08/18  06/05/18 04/06/16  PSA  Total PSA 4.49 ng/mL 4.23 ng/dl 2.24 ng/dl  Free PSA 0.59 ng/mL    % Free PSA 13 % PSA      PROCEDURES: None   ASSESSMENT:     ICD-10 Details  1 GU:  Prostate Cancer - C61    PLAN:   Document  Letter(s):  Created for Patient: Clinical Summary   Notes:  1. Prostate cancer: The patient was counseled about the natural history of prostate cancer and the standard treatment options that are available for prostate cancer. It was explained to him how his age and life expectancy, clinical stage, Gleason score, and PSA affect his prognosis, the decision to  proceed with additional staging studies, as well as how that information influences recommended treatment strategies. We discussed the roles for active surveillance, radiation therapy, surgical therapy, androgen deprivation, as well as ablative therapy options for the treatment of prostate cancer as appropriate to his individual cancer situation. We discussed the risks and benefits of these options with regard to their impact on cancer control and also in terms of potential adverse events, complications, and impact on quality of life particularly related to urinary and sexual function. The patient was encouraged to ask questions throughout the discussion today and all questions were answered to his stated satisfaction. In addition, the patient was provided with and/or directed to appropriate resources and literature for further education about prostate cancer and treatment options.   We discussed surgical therapy for prostate cancer including the different available surgical approaches. We discussed, in detail, the risks and expectations of surgery with regard to cancer control, urinary control, and erectile function as well as the expected postoperative recovery process. Additional risks of surgery including but not limited to bleeding, infection, hernia formation, nerve damage, lymphocele formation, bowel/rectal injury  potentially necessitating colostomy, damage to the urinary tract resulting in urine leakage, urethral stricture, and the cardiopulmonary risks such as myocardial infarction, stroke, death, venothromboembolism, etc. were explained. The risk of open surgical conversion for robotic/laparoscopic prostatectomy was also discussed.   I had a long discussion with James Gutierrez and his wife today. He is scheduled to also see Dr. Tammi Klippel later this morning. He appears to be leaning toward surgical therapy but had not yet made a final decision. If he proceeds with surgery, my plan would be to perform a bilateral nerve sparing RAL radical prostatectomy and BPLND.   CC: Dr. Irine Seal  Dr. Tyler Pita  Gaye Alken, NP

## 2018-10-18 NOTE — Progress Notes (Signed)
Radiation Oncology         [phone] ________________________________  Multidisciplinary Prostate Cancer Clinic  Initial Radiation Oncology Consultation  Name: James Gutierrez MRN: [id number]  Date: 10/18/2018  DOB: 1956/05/08  WU:[id number], Pcp Not In  Raynelle Bring, MD   REFERRING PHYSICIAN: Raynelle Bring, MD  DIAGNOSIS: 63 y.o. gentleman with stage T1c adenocarcinoma of the prostate with a Gleason's score of 4+4 and a PSA of 4.49    ICD-10-CM   1. Malignant neoplasm of prostate ([place]) C61     HISTORY OF PRESENT ILLNESS::James Gutierrez is a 63 y.o. gentleman.  He was noted to have an elevated PSA of 4.23 in 06/2018 by his primary care provider, James Gutierrez, [place].  Prior PSA was 2.24 in 04/2016.  Accordingly, he was referred for evaluation in urology by Dr. Jeffie Pollock on 07/29/2018,  digital rectal examination was performed at that time revealing symmetrical prostate lobes without discrete nodularity.  The patient proceeded to transrectal ultrasound with 12 biopsies of the prostate on 08/19/2018.  The prostate volume measured 23 cc.  Out of 12 core biopsies, 3 were positive.  The maximum Gleason score was 4+4, and this was seen in the left apex lateral, left base, and left apex.  He underwent CT abdomen/pelvis on 09/16/2018 for disease staging, and this scan did not demonstrate any findings highly suspicious for metastatic disease in the abdomen or pelvis and no lymphadenopathy. He also had a bone scan that same day, with results showing no scintigraphic evidence of osseous metastatic disease.  The patient reviewed the biopsy results with his urologist and he has kindly been referred today to the multidisciplinary prostate cancer clinic for presentation of pathology and radiology studies in our conference for discussion of potential radiation treatment options and clinical evaluation.    PREVIOUS RADIATION THERAPY: No  PAST MEDICAL HISTORY:  has a past medical history of Asthma,  Hypercholesterolemia, and Prostate cancer ([place]).    PAST SURGICAL HISTORY: Past Surgical History:  Procedure Laterality Date  . ORIF FINGER FRACTURE  07/21/2011   Procedure: OPEN REDUCTION INTERNAL FIXATION (ORIF) METACARPAL (FINGER) FRACTURE;  Surgeon: Tennis Must;  Location: [place];  Service: Orthopedics;  Laterality: Left;  left thumb laceration   . PROSTATE BIOPSY      FAMILY HISTORY: family history includes Lung cancer in his father; Prostate cancer in his paternal grandfather.  SOCIAL HISTORY:  reports that he quit smoking about 3 weeks ago. His smoking use included cigarettes. He has a 43.00 pack-year smoking history. He has never used smokeless tobacco. He reports current alcohol use. He reports that he does not use drugs.  ALLERGIES: Patient has no known allergies.  MEDICATIONS:  No current outpatient medications on file.   No current facility-administered medications for this encounter.     REVIEW OF SYSTEMS:  On review of systems, the patient reports that he is doing well overall. Per patient form, he reports wearing glasses, dental problems (dentures), and back pain. He denies any chest pain, shortness of breath, cough, fevers, chills, night sweats, unintended weight changes. He denies any bowel disturbances, and denies abdominal pain, nausea or vomiting. He denies any new musculoskeletal or joint aches or pains. His IPSS was 14, indicating moderate urinary symptoms. His SHIM was 19, indicating he has mild erectile dysfunction. A complete review of systems is obtained and is otherwise negative.   PHYSICAL EXAM:  Wt Readings from Last 3 Encounters:  10/18/18 168 lb 6.4 oz (76.4 kg)  07/20/11 170 lb (77.1 kg)  Temp Readings from Last 3 Encounters:  10/18/18 98.4 F (36.9 C) (Oral)  07/21/11 98.2 F (36.8 C)   BP Readings from Last 3 Encounters:  10/18/18 (!) 143/77  07/21/11 (!) 146/86   Pulse Readings from Last 3 Encounters:  10/18/18 64  07/21/11 68   Pain  Assessment Pain Score: 0-No pain/10  In general this is a well appearing African American gentleman in no acute distress. He is alert and oriented x4 and appropriate throughout the examination. HEENT reveals that the patient is normocephalic, atraumatic. EOMs are intact. PERRLA. Skin is intact without any evidence of gross lesions. Cardiovascular exam reveals a regular rate and rhythm, no clicks rubs or murmurs are auscultated. Chest is clear to auscultation bilaterally. Lymphatic assessment is performed and does not reveal any adenopathy in the cervical, supraclavicular, axillary, or inguinal chains. Abdomen has active bowel sounds in all quadrants and is intact. The abdomen is soft, non tender, non distended. Lower extremities are negative for pretibial pitting edema, deep calf tenderness, cyanosis or clubbing.  KPS = 90  100 - Normal; no complaints; no evidence of disease. 90   - Able to carry on normal activity; minor signs or symptoms of disease. 80   - Normal activity with effort; some signs or symptoms of disease. 45   - Cares for self; unable to carry on normal activity or to do active work. 60   - Requires occasional assistance, but is able to care for most of his personal needs. 50   - Requires considerable assistance and frequent medical care. 73   - Disabled; requires special care and assistance. 55   - Severely disabled; hospital admission is indicated although death not imminent. 4   - Very sick; hospital admission necessary; active supportive treatment necessary. 10   - Moribund; fatal processes progressing rapidly. 0     - Dead  Karnofsky DA, Abelmann WH, Craver LS and Burchenal JH [phone]) The use of the nitrogen mustards in the palliative treatment of carcinoma: with particular reference to bronchogenic carcinoma Cancer 1 [phone]   LABORATORY DATA:  No results found for: WBC, HGB, HCT, MCV, PLT No results found for: NA, K, CL, CO2 No results found for: ALT, AST, GGT, ALKPHOS,  BILITOT   RADIOGRAPHY: No results found.    IMPRESSION/PLAN: 63 y.o. gentleman with Stage T1c adenocarcinoma of the prostate with a PSA of 4.49 and a Gleason score of 4+4.    We discussed the patient's workup and outlined the nature of prostate cancer in this setting. The patient's T stage, Gleason's score, and PSA put him into the high risk group. Accordingly, he is eligible for a variety of potential treatment options including prostatectomy or LT-ADT in combination with either 8 weeks of external radiation or 5 weeks of external radiation proceeded by a brachytherapy boost. We discussed the available radiation techniques, and focused on the details and logistics and delivery. We discussed and outlined the risks, benefits, short and long-term effects associated with radiotherapy and compared and contrasted these with prostatectomy. The patient focused most of his questions and interest in robotic-assisted laparoscopic radical prostatectomy.  We discussed some of the potential advantages of surgery including surgical staging, the availability of salvage radiotherapy to the prostatic fossa, and the confidence associated with immediate biochemical response.  We discussed some of the potential proven indications for postoperative radiotherapy including positive margins, extracapsular extension, and seminal vesicle involvement. We also talked about some of the other potential findings leading to a recommendation for  radiotherapy including a non-zero postoperative PSA and positive lymph nodes.   At the end of the conversation the patient is interested in moving forward with prostatectomy. We enjoyed meeting with him today, and would be more than happy to continue to participate in the care of this very nice gentleman.  We will look forward to following his progress.    Nicholos Johns, PA-C    Tyler Pita, MD  [facility] Direct Dial: [phone]  Fax:  [phone] [email or web address]  Skype  [email or web address]   This document serves as a record of services personally performed by Tyler Pita, MD and Freeman Caldron, PA-C. It was created on their behalf by Wilburn Mylar, a trained medical scribe. The creation of this record is based on the scribe's personal observations and the provider's statements to them. This document has been checked and approved by the attending provider.

## 2018-10-18 NOTE — Progress Notes (Signed)
[place] Psychosocial Distress Screening Spiritual Care  Met with James Gutierrez and his wife James Gutierrez in [facility] to introduce [place] team/resources, reviewing distress screen per protocol.  The patient scored a 0 on the Psychosocial Distress Thermometer which indicates minimal distress. Also assessed for distress and other psychosocial needs.   ONCBCN DISTRESS SCREENING 10/18/2018  Screening Type Initial Screening  Distress experienced in past week (1-10) 0  Emotional problem type Adjusting to illness  Referral to support programs Yes    Per James Gutierrez, after serving in [employer], he continues to cope by and find enjoyment in lifting weights and playing basketball. 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  Introduced [place] team/programming resources as additional tools for meaning-making, purpose, coping, and whole-person healing/wellness.    Follow up needed: No. Per couple, no other needs at this time, but please page if needs arise or circumstances change. Thank you.   [place], [place], [facility] Pager [phone] Voicemail [phone]

## 2018-10-18 NOTE — Progress Notes (Signed)
                               Care Plan Summary  Name: Mr. Craig Ionescu DOB: 1956-07-09   Your Medical Team:   Urologist -  Dr. Raynelle Bring, [facility]  Radiation Oncologist - Dr. Tyler Pita, [place]     Recommendations: 1) Robotic prostatectomy 2) Brachytherapy (seed implant)  3) Radiation  * These recommendations are based on information available as of today's consult.      Recommendations may change depending on the results of further tests or exams.  Next Steps: 1) Consider your options and call Cira Rue, RN with your treatment decision.   When appointments need to be scheduled, you will be contacted by [facility] and/or [facility].  Patient provided with business cards for all team members and a copy of "Fall Prevention Patient Safety Sheet". Questions?  Please do not hesitate to call Cira Rue, RN, BSN, OCN at [phone] any questions or concerns.  Shirlean Mylar is your Oncology Nurse Navigator and is available to assist you while you're receiving your medical care at [facility].

## 2018-10-19 DIAGNOSIS — C61 Malignant neoplasm of prostate: Secondary | ICD-10-CM | POA: Insufficient documentation

## 2018-10-25 ENCOUNTER — Encounter: Payer: Self-pay | Admitting: Medical Oncology

## 2018-10-28 NOTE — Progress Notes (Signed)
James Gutierrez called stating he has decided on robotic prostatectomy as treatment for his prostate cancer. I informed him I will notify Dr. Alinda Money and he will receive a call from Dr. Lynne Logan office with surgery date. He voiced understanding. Dr. Alinda Money notified of patient's decision.

## 2018-11-05 ENCOUNTER — Other Ambulatory Visit: Payer: Self-pay | Admitting: Urology

## 2018-11-07 ENCOUNTER — Telehealth: Payer: Self-pay | Admitting: Medical Oncology

## 2018-11-07 NOTE — Telephone Encounter (Signed)
Mr. James Gutierrez called stating he has FMLA paperwork to be completed. He is scheduled for robotic prostatectomy 4/16 with Dr. Alinda Money. I explained that he needs to take the forms to Dr. Lynne Logan office to be completed. He voiced understanding.

## 2018-12-12 ENCOUNTER — Encounter (HOSPITAL_COMMUNITY): Payer: Self-pay | Admitting: *Deleted

## 2018-12-12 NOTE — Patient Instructions (Addendum)
James Gutierrez        Your procedure is scheduled on:  12-19-2018    Report to [facility] Main  Entrance,  Report to Short Stay at  5:30 AM    Call this number if you have problems the morning of surgery [phone]      Special Instructions:  Drink one 8oz bottle magnesium citrate at noon day before surgery.                                          And do one fleet enema night before surgery.      Remember: Do not eat food or drink liquids :After Midnight.    BRUSH YOUR TEETH MORNING OF SURGERY AND RINSE YOUR MOUTH OUT, NO CHEWING GUM CANDY OR MINTS.        Take these medicines the morning of surgery with A SIP OF WATER:  NONE                                    You may not have any metal on your body including piercings              Do not wear jewelry, lotions, powders or perfumes, deodorant              Men may shave face and neck.      Do not bring valuables to the hospital. [place].  Contacts, dentures or bridgework may not be worn into surgery.  Leave suitcase in the car. After surgery it may be brought to your room.      _____________________________________________________________________             [facility] - Preparing for Surgery Before surgery, you can play an important role.  Because skin is not sterile, your skin needs to be as free of germs as possible.  You can reduce the number of germs on your skin by washing with CHG (chlorahexidine gluconate) soap before surgery.  CHG is an antiseptic cleaner which kills germs and bonds with the skin to continue killing germs even after washing. Please DO NOT use if you have an allergy to CHG or antibacterial soaps.  If your skin becomes reddened/irritated stop using the CHG and inform your nurse when you arrive at Short Stay. Do not shave (including legs and underarms) for at least 48 hours prior to the first CHG  shower.  You may shave your face/neck. Please follow these instructions carefully:  1.  Shower with CHG Soap the night before surgery and the  morning of Surgery.  2.  If you choose to wash your hair, wash your hair first as usual with your  normal  shampoo.  3.  After you shampoo, rinse your hair and body thoroughly to remove the  shampoo.                            4.  Use CHG as you would any other liquid soap.  You can apply chg directly  to the skin and wash  Gently with a scrungie or clean washcloth.  5.  Apply the CHG Soap to your body ONLY FROM THE NECK DOWN.   Do not use on face/ open                           Wound or open sores. Avoid contact with eyes, ears mouth and genitals (private parts).                       Wash face,  Genitals (private parts) with your normal soap.             6.  Wash thoroughly, paying special attention to the area where your surgery  will be performed.  7.  Thoroughly rinse your body with warm water from the neck down.  8.  DO NOT shower/wash with your normal soap after using and rinsing off  the CHG Soap.             9.  Pat yourself dry with a clean towel.            10.  Wear clean pajamas.            11.  Place clean sheets on your bed the night of your first shower and do not  sleep with pets. Day of Surgery : Do not apply any lotions/deodorants the morning of surgery.  Please wear clean clothes to the hospital/surgery center.  FAILURE TO FOLLOW THESE INSTRUCTIONS MAY RESULT IN THE CANCELLATION OF YOUR SURGERY PATIENT SIGNATURE_________________________________  NURSE SIGNATURE__________________________________  ________________________________________________________________________  WHAT IS A BLOOD TRANSFUSION? Blood Transfusion Information  A transfusion is the replacement of blood or some of its parts. Blood is made up of multiple cells which provide different functions.  Red blood cells carry oxygen and are used for  blood loss replacement.  White blood cells fight against infection.  Platelets control bleeding.  Plasma helps clot blood.  Other blood products are available for specialized needs, such as hemophilia or other clotting disorders. BEFORE THE TRANSFUSION  Who gives blood for transfusions?   Healthy volunteers who are fully evaluated to make sure their blood is safe. This is blood bank blood. Transfusion therapy is the safest it has ever been in the practice of medicine. Before blood is taken from a donor, a complete history is taken to make sure that person has no history of diseases nor engages in risky social behavior (examples are intravenous drug use or sexual activity with multiple partners). The donor's travel history is screened to minimize risk of transmitting infections, such as malaria. The donated blood is tested for signs of infectious diseases, such as HIV and hepatitis. The blood is then tested to be sure it is compatible with you in order to minimize the chance of a transfusion reaction. If you or a relative donates blood, this is often done in anticipation of surgery and is not appropriate for emergency situations. It takes many days to process the donated blood. RISKS AND COMPLICATIONS Although transfusion therapy is very safe and saves many lives, the main dangers of transfusion include:   Getting an infectious disease.  Developing a transfusion reaction. This is an allergic reaction to something in the blood you were given. Every precaution is taken to prevent this. The decision to have a blood transfusion has been considered carefully by your caregiver before blood is given. Blood is not given unless the benefits outweigh the risks. AFTER  THE TRANSFUSION  Right after receiving a blood transfusion, you will usually feel much better and more energetic. This is especially true if your red blood cells have gotten low (anemic). The transfusion raises the level of the red blood  cells which carry oxygen, and this usually causes an energy increase.  The nurse administering the transfusion will monitor you carefully for complications. HOME CARE INSTRUCTIONS  No special instructions are needed after a transfusion. You may find your energy is better. Speak with your caregiver about any limitations on activity for underlying diseases you may have. SEEK MEDICAL CARE IF:   Your condition is not improving after your transfusion.  You develop redness or irritation at the intravenous (IV) site. SEEK IMMEDIATE MEDICAL CARE IF:  Any of the following symptoms occur over the next 12 hours:  Shaking chills.  You have a temperature by mouth above 102 F (38.9 C), not controlled by medicine.  Chest, back, or muscle pain.  People around you feel you are not acting correctly or are confused.  Shortness of breath or difficulty breathing.  Dizziness and fainting.  You get a rash or develop hives.  You have a decrease in urine output.  Your urine turns a dark color or changes to pink, red, or brown. Any of the following symptoms occur over the next 10 days:  You have a temperature by mouth above 102 F (38.9 C), not controlled by medicine.  Shortness of breath.  Weakness after normal activity.  The white part of the eye turns yellow (jaundice).  You have a decrease in the amount of urine or are urinating less often.  Your urine turns a dark color or changes to pink, red, or brown. Document Released: 08/18/2000 Document Revised: 11/13/2011 Document Reviewed: 04/06/2008 ExitCare Patient Information 2014 [place].  _______________________________________________________________________    Incentive Spirometer  An incentive spirometer is a tool that can help keep your lungs clear and active. This tool measures how well you are filling your lungs with each breath. Taking long deep breaths may help reverse or decrease the chance of developing breathing  (pulmonary) problems (especially infection) following:  A long period of time when you are unable to move or be active. BEFORE THE PROCEDURE   If the spirometer includes an indicator to show your best effort, your nurse or respiratory therapist will set it to a desired goal.  If possible, sit up straight or lean slightly forward. Try not to slouch.  Hold the incentive spirometer in an upright position. INSTRUCTIONS FOR USE  1. Sit on the edge of your bed if possible, or sit up as far as you can in bed or on a chair. 2. Hold the incentive spirometer in an upright position. 3. Breathe out normally. 4. Place the mouthpiece in your mouth and seal your lips tightly around it. 5. Breathe in slowly and as deeply as possible, raising the piston or the ball toward the top of the column. 6. Hold your breath for 3-5 seconds or for as long as possible. Allow the piston or ball to fall to the bottom of the column. 7. Remove the mouthpiece from your mouth and breathe out normally. 8. Rest for a few seconds and repeat Steps 1 through 7 at least 10 times every 1-2 hours when you are awake. Take your time and take a few normal breaths between deep breaths. 9. The spirometer may include an indicator to show your best effort. Use the indicator as a goal to work toward  during each repetition. 10. After each set of 10 deep breaths, practice coughing to be sure your lungs are clear. If you have an incision (the cut made at the time of surgery), support your incision when coughing by placing a pillow or rolled up towels firmly against it. Once you are able to get out of bed, walk around indoors and cough well. You may stop using the incentive spirometer when instructed by your caregiver.  RISKS AND COMPLICATIONS  Take your time so you do not get dizzy or light-headed.  If you are in pain, you may need to take or ask for pain medication before doing incentive spirometry. It is harder to take a deep breath if you  are having pain. AFTER USE  Rest and breathe slowly and easily.  It can be helpful to keep track of a log of your progress. Your caregiver can provide you with a simple table to help with this. If you are using the spirometer at home, follow these instructions: [place] IF:   You are having difficultly using the spirometer.  You have trouble using the spirometer as often as instructed.  Your pain medication is not giving enough relief while using the spirometer.  You develop fever of 100.5 F (38.1 C) or higher. SEEK IMMEDIATE MEDICAL CARE IF:   You cough up bloody sputum that had not been present before.  You develop fever of 102 F (38.9 C) or greater.  You develop worsening pain at or near the incision site. MAKE SURE YOU:   Understand these instructions.  Will watch your condition.  Will get help right away if you are not doing well or get worse. Document Released: 01/01/2007 Document Revised: 11/13/2011 Document Reviewed: 03/04/2007 [facility] Patient Information 2014 [place], [place].   ________________________________________________________________________

## 2018-12-16 ENCOUNTER — Encounter (HOSPITAL_COMMUNITY): Payer: Self-pay

## 2018-12-16 ENCOUNTER — Other Ambulatory Visit: Payer: Self-pay

## 2018-12-16 ENCOUNTER — Encounter (HOSPITAL_COMMUNITY)
Admission: RE | Admit: 2018-12-16 | Discharge: 2018-12-16 | Disposition: A | Discharge status: Home / Self Care | Payer: PRIVATE HEALTH INSURANCE | Source: Ambulatory Visit | Attending: Urology | Admitting: Urology

## 2018-12-16 DIAGNOSIS — J449 Chronic obstructive pulmonary disease, unspecified: Secondary | ICD-10-CM | POA: Diagnosis not present

## 2018-12-16 DIAGNOSIS — C61 Malignant neoplasm of prostate: Secondary | ICD-10-CM

## 2018-12-16 DIAGNOSIS — Z01812 Encounter for preprocedural laboratory examination: Secondary | ICD-10-CM | POA: Insufficient documentation

## 2018-12-16 DIAGNOSIS — Z87891 Personal history of nicotine dependence: Secondary | ICD-10-CM | POA: Diagnosis not present

## 2018-12-16 HISTORY — DX: Emphysema, unspecified: J43.9

## 2018-12-16 LAB — BASIC METABOLIC PANEL
Anion gap: 7 (ref 5–15)
BUN: 15 mg/dL (ref 8–23)
CO2: 25 mmol/L (ref 22–32)
Calcium: 9.2 mg/dL (ref 8.9–10.3)
Chloride: 107 mmol/L (ref 98–111)
Creatinine, Ser: 1.02 mg/dL (ref 0.61–1.24)
GFR calc Af Amer: >60 mL/min (ref >60)
GFR calc non Af Amer: >60 mL/min (ref >60)
Glucose, Bld: 112 mg/dL — ABNORMAL HIGH (ref 70–99)
Potassium: 5 mmol/L (ref 3.5–5.1)
Sodium: 139 mmol/L (ref 135–145)

## 2018-12-16 LAB — CBC
HCT: 42.1 % (ref 39.0–52.0)
Hemoglobin: 13.6 g/dL (ref 13.0–17.0)
MCH: 27.5 pg (ref 26.0–34.0)
MCHC: 32.3 g/dL (ref 30.0–36.0)
MCV: 85.2 fL (ref 80.0–100.0)
Platelets: 363 10*3/uL (ref 150–400)
RBC: 4.94 MIL/uL (ref 4.22–5.81)
RDW: 13.3 % (ref 11.5–15.5)
WBC: 8.2 10*3/uL (ref 4.0–10.5)
nRBC: 0 % (ref 0.0–0.2)

## 2018-12-16 LAB — ABO/RH: ABO/RH(D): A POS

## 2018-12-18 NOTE — H&P (Signed)
James Gutierrez         MRN: [id number]  PRIMARY CARE:  Gaye Alken, NP  DOB: 18-May-1956, 63 year old Male  REFERRING:  Marilynne Drivers, [place]  SSN:   PROVIDER:  Irine Seal, M.D.    TREATING:  Raynelle Bring, M.D.    LOCATION:  [facility] [phone]    CC/HPI: CC: Prostate Cancer   Physician requesting consult: Dr. Irine Seal  PCP: Gaye Alken, NP  Location of consult: [facility] - Prostate Cancer Multidisciplinary [facility]   James Gutierrez is a 63 year old gentleman who was found to have an elevated PSA of 4.49 prompting a TRUS biopsy of the prostate on 08/19/18. 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   Family history: None.   Imaging studies:  Bone scan (09/16/18): Negative for malignancy.  CT abdomen and pelvis (09/16/18): Negative for malignancy. 4 mm hypodense indeterminate lesion of liver.   PMH: He has a history of asthma and GERD.  PSH: No abdominal surgeries.   TNM stage: cT1c N0 M0  PSA: 4.49  Gleason score: 4+4=8  Biopsy (08/19/18): 3/12 cores positive  Left: L lateral apex (10%, 4+4=8), L apex (40%, 4+4=8), L base (1%, 4+4=8)  Right: Benign  Prostate volume: 23.0 cc   Nomogram  OC disease: 34%  EPE: 63%  SVI: 10%  LNI: 11%  PFS (5 year, 10 year): 59%, 44%   Urinary function: IPSS is 14.  Erectile function: SHIM score is 19.     ALLERGIES: No Allergies    MEDICATIONS: Bc 845 mg-65 mg powder in packet  Tylenol 1 PO Daily     GU PSH: Locm 300-399Mg /Ml Iodine,1Ml - 09/16/2018 Prostate Needle Biopsy - 08/19/2018    NON-GU PSH: Hand/finger Surgery Surgical Pathology, Gross And Microscopic Examination For Prostate Needle - 08/19/2018        GU PMH: BPH w/LUTS - 09/24/2018 ED due to arterial insufficiency - 09/24/2018 Prostate Cancer, He has T1c N0 M0 Gleason 8 relatively low volume prostate cancer with a 65ml prostate, mild to moderate  ED and moderate LUTS. I discussed the treatment options with him and don't believe he is a good candidate for AS, Seed monotherapy, cryo or HIFU but a good candidate for RALP or EXRT w/wo seed boost and 2 years of ADT. I am going to try to get him into the [facility] for further discussion of the options. - 09/24/2018 Urinary Urgency - 09/24/2018 Weak Urinary Stream - 09/24/2018 Elevated PSA - 08/19/2018, His PSA has doubled over the last 2 years but his exam is benign. I will repeat the PSA after a week of sexual abstinence and if it remains elevated, he will return for a biopsy. I have reviewed the risks of bleeding, infection and voiding difficulty. , - 07/29/2018    NON-GU PMH: Asthma Hypercholesterolemia    FAMILY HISTORY: Cancer - Father Diabetes - Runs in Family Kidney Stones - Sister Prostate Cancer - Grandfather   SOCIAL HISTORY: Marital Status: Married Preferred Language: English; Race: Black or African American Current Smoking Status: Patient smokes.  <DIV'  Tobacco Use Assessment Completed:  Used Tobacco in last 30 days?   Drinks 1 drink per day.  Drinks 3 caffeinated drinks per day.     Notes: 1 son, 1 daughter    REVIEW OF SYSTEMS:     GU Review Male:  Patient denies frequent urination, hard to postpone urination, burning/ pain with urination, get up at night to urinate, leakage of urine, stream starts and stops, trouble starting your streams, and have to strain to urinate .    Gastrointestinal (Upper):  Patient denies nausea and vomiting.    Gastrointestinal (Lower):  Patient denies diarrhea and constipation.    Constitutional:  Patient denies fever, night sweats, weight loss, and fatigue.    Skin:  Patient denies skin rash/ lesion and itching.    Eyes:  Patient denies blurred vision and double vision.    Ears/ Nose/ Throat:  Patient denies sore throat and sinus problems.    Hematologic/Lymphatic:  Patient denies swollen glands and easy bruising.    Cardiovascular:  Patient  denies leg swelling and chest pains.    Respiratory:  Patient denies cough and shortness of breath.    Endocrine:  Patient denies excessive thirst.    Musculoskeletal:  Patient denies back pain and joint pain.    Neurological:  Patient denies headaches and dizziness.    Psychologic:  Patient denies depression and anxiety.                      MULTI-SYSTEM PHYSICAL EXAMINATION:      Constitutional: Well-nourished. No physical deformities. Normally developed. Good grooming.     Neck: Neck symmetrical, not swollen. Normal tracheal position.     Respiratory: No labored breathing, no use of accessory muscles. Clear bilaterally.     Cardiovascular: Normal temperature, normal extremity pulses, no swelling, no varicosities. Regular rate and rhythm.     Lymphatic: No enlargement of neck, axillae, groin.     Skin: No paleness, no jaundice, no cyanosis. No lesion, no ulcer, no rash.     Neurologic / Psychiatric: Oriented to time, oriented to place, oriented to person. No depression, no anxiety, no agitation.     Gastrointestinal: No mass, no tenderness, no rigidity, non obese abdomen.     Eyes: Normal conjunctivae. Normal eyelids.     Ears, Nose, Mouth, and Throat: Left ear no scars, no lesions, no masses. Right ear no scars, no lesions, no masses. Nose no scars, no lesions, no masses. Normal hearing. Normal lips.     Musculoskeletal: Normal gait and station of head and neck.              ASSESSMENT:     ICD-10 Details  1 GU:  Prostate Cancer - C61    PLAN:       1. Prostate cancer: He has elected to undergo surgical treatment and will proceed with a bilateral nerve sparing RAL radical prostatectomy and BPLND.

## 2018-12-18 NOTE — Anesthesia Preprocedure Evaluation (Addendum)
Anesthesia Evaluation  Patient identified by MRN, date of birth, ID band Patient awake    Reviewed: Allergy & Precautions, NPO status , Patient's Chart, lab work & pertinent test results  History of Anesthesia Complications Negative for: history of anesthetic complications  Airway Mallampati: II  TM Distance: >3 FB     Dental  (+) Dental Advisory Given, Edentulous Upper, Partial Lower   Pulmonary asthma , COPD, former smoker,    Pulmonary exam normal        Cardiovascular negative cardio ROS Normal cardiovascular exam     Neuro/Psych negative neurological ROS     GI/Hepatic negative GI ROS, Neg liver ROS,   Endo/Other  negative endocrine ROS  Renal/GU      Musculoskeletal negative musculoskeletal ROS (+)   Abdominal   Peds  Hematology negative hematology ROS (+)   Anesthesia Other Findings Day of surgery medications reviewed with the patient.  Reproductive/Obstetrics                            Anesthesia Physical Anesthesia Plan  ASA: III  Anesthesia Plan: General   Post-op Pain Management:    Induction: Intravenous  PONV Risk Score and Plan: 3 and Ondansetron, Dexamethasone and Scopolamine patch - Pre-op  Airway Management Planned: Oral ETT  Additional Equipment:   Intra-op Plan:   Post-operative Plan: Extubation in OR  Informed Consent: I have reviewed the patients History and Physical, chart, labs and discussed the procedure including the risks, benefits and alternatives for the proposed anesthesia with the patient or authorized representative who has indicated his/her understanding and acceptance.     Dental advisory given  Plan Discussed with: CRNA and Anesthesiologist  Anesthesia Plan Comments:        Anesthesia Quick Evaluation

## 2018-12-18 NOTE — Progress Notes (Signed)
Anesthesia Chart Review   Case:  [id number] Date/Time:  12/19/18 0700   Procedures:      XI ROBOTIC ASSISTED LAPAROSCOPIC RADICAL PROSTATECTOMY LEVEL 2 (N/A )     LYMPHADENECTOMY, PELVIC (Bilateral )   Anesthesia type:  General   Pre-op diagnosis:  PROSTATE CANCER   Location:  [place] [facility] / [facility]   Surgeon:  Raynelle Bring, MD      DISCUSSION:63 yo former smoker (43 pack years, quit 09/25/18) with h/o hypercholesteremia, prostate cancer scheduled for above procedure 12/19/18 with Dr. Raynelle Bring.   Pt can proceed with planned procedure barring acute status change.  VS: BP 140/77   Pulse 69   Temp 36.4 C (Oral)   Resp 16   Ht 5\' 7"  (1.702 m)   Wt 77.6 kg   SpO2 100%   BMI 26.80 kg/m   PROVIDERS: Maury Dus, MD is PCP    LABS: Labs reviewed: Acceptable for surgery. (all labs ordered are listed, but only abnormal results are displayed)  Labs Reviewed  BASIC METABOLIC PANEL - Abnormal; Notable for the following components:      Result Value   Glucose, Bld 112 (*)    All other components within normal limits  CBC  TYPE AND SCREEN  ABO/RH     IMAGES:   EKG:   CV:  Past Medical History:  Diagnosis Date  . Asthma    as a child  . ED (erectile dysfunction)   . Emphysema lung ([place])    Ct scan done 09/2018  . Hypercholesterolemia   . Hyperplasia of prostate with lower urinary tract symptoms (LUTS)   . Prostate cancer [facility])    dx 08-19-2018  Stage T1c, Gleason 4+4    Past Surgical History:  Procedure Laterality Date  . COLONOSCOPY      aprox 5 years ago  . lt thumb surgery    . ORIF FINGER FRACTURE  07/21/2011   Procedure: OPEN REDUCTION INTERNAL FIXATION (ORIF) METACARPAL (FINGER) FRACTURE;  Surgeon: Tennis Must;  Location: [facility];  Service: Orthopedics;  Laterality: Left;  left thumb laceration   . PROSTATE BIOPSY  08-19-2018   dr Jeffie Pollock office    MEDICATIONS: . Acetaminophen (TYLENOL PO)   No current facility-administered medications for this  encounter.     Maia Plan [facility] Pre-Surgical Testing [phone] 12/18/18 2:45 PM

## 2018-12-19 ENCOUNTER — Observation Stay (HOSPITAL_COMMUNITY)
Admission: RE | Admit: 2018-12-19 | Discharge: 2018-12-20 | Disposition: A | Discharge status: Home / Self Care | Payer: PRIVATE HEALTH INSURANCE | Attending: Urology | Admitting: Urology

## 2018-12-19 ENCOUNTER — Encounter (HOSPITAL_COMMUNITY): Payer: Self-pay | Admitting: Emergency Medicine

## 2018-12-19 ENCOUNTER — Ambulatory Visit (HOSPITAL_COMMUNITY): Payer: PRIVATE HEALTH INSURANCE | Admitting: Anesthesiology

## 2018-12-19 ENCOUNTER — Other Ambulatory Visit: Payer: Self-pay

## 2018-12-19 ENCOUNTER — Encounter (HOSPITAL_COMMUNITY)
Admission: RE | Disposition: A | Discharge status: Home / Self Care | Payer: Self-pay | Source: Home / Self Care | Attending: Urology

## 2018-12-19 ENCOUNTER — Ambulatory Visit (HOSPITAL_COMMUNITY): Payer: PRIVATE HEALTH INSURANCE | Admitting: Physician Assistant

## 2018-12-19 DIAGNOSIS — C61 Malignant neoplasm of prostate: Principal | ICD-10-CM | POA: Insufficient documentation

## 2018-12-19 DIAGNOSIS — Z01812 Encounter for preprocedural laboratory examination: Secondary | ICD-10-CM | POA: Insufficient documentation

## 2018-12-19 DIAGNOSIS — J449 Chronic obstructive pulmonary disease, unspecified: Secondary | ICD-10-CM | POA: Insufficient documentation

## 2018-12-19 DIAGNOSIS — Z87891 Personal history of nicotine dependence: Secondary | ICD-10-CM | POA: Insufficient documentation

## 2018-12-19 HISTORY — PX: LYMPHADENECTOMY: SHX5960

## 2018-12-19 HISTORY — DX: Benign prostatic hyperplasia with lower urinary tract symptoms: N40.1

## 2018-12-19 HISTORY — PX: ROBOT ASSISTED LAPAROSCOPIC RADICAL PROSTATECTOMY: SHX5141

## 2018-12-19 HISTORY — DX: Male erectile dysfunction, unspecified: N52.9

## 2018-12-19 LAB — TYPE AND SCREEN
ABO/RH(D): A POS
Antibody Screen: NEGATIVE

## 2018-12-19 LAB — HEMOGLOBIN AND HEMATOCRIT, BLOOD
HCT: 34.4 % — ABNORMAL LOW (ref 39.0–52.0)
Hemoglobin: 11.2 g/dL — ABNORMAL LOW (ref 13.0–17.0)

## 2018-12-19 SURGERY — XI ROBOTIC ASSISTED LAPAROSCOPIC RADICAL PROSTATECTOMY LEVEL 2
Anesthesia: General

## 2018-12-19 MED ORDER — EPHEDRINE SULFATE-NACL 50-0.9 MG/10ML-% IV SOSY
PREFILLED_SYRINGE | INTRAVENOUS | Status: DC | PRN
  Administered 2018-12-19 (×4): 10 mg via INTRAVENOUS

## 2018-12-19 MED ORDER — ACETAMINOPHEN 500 MG PO TABS
1000.0000 mg | ORAL_TABLET | Freq: Once | ORAL | Status: AC
  Administered 2018-12-19: 06:00:00 1000 mg via ORAL
  Filled 2018-12-19: qty 2

## 2018-12-19 MED ORDER — PROPOFOL 10 MG/ML IV BOLUS
INTRAVENOUS | Status: AC
  Filled 2018-12-19: qty 20

## 2018-12-19 MED ORDER — FENTANYL CITRATE (PF) 100 MCG/2ML IJ SOLN: 25.0000 ug | INTRAMUSCULAR | Status: DC | PRN

## 2018-12-19 MED ORDER — ONDANSETRON HCL 4 MG/2ML IJ SOLN: 4.0000 mg | INTRAMUSCULAR | Status: DC | PRN

## 2018-12-19 MED ORDER — PROPOFOL 10 MG/ML IV BOLUS
INTRAVENOUS | Status: DC | PRN
  Administered 2018-12-19: 150 mg via INTRAVENOUS

## 2018-12-19 MED ORDER — CEFAZOLIN SODIUM-DEXTROSE 2-4 GM/100ML-% IV SOLN
2.0000 g | Freq: Once | INTRAVENOUS | Status: AC
  Administered 2018-12-19: 2 g via INTRAVENOUS
  Filled 2018-12-19: qty 100

## 2018-12-19 MED ORDER — ZOLPIDEM TARTRATE 5 MG PO TABS: 5.0000 mg | ORAL_TABLET | Freq: Every evening | ORAL | Status: DC | PRN

## 2018-12-19 MED ORDER — ROCURONIUM BROMIDE 10 MG/ML (PF) SYRINGE
PREFILLED_SYRINGE | INTRAVENOUS | Status: DC | PRN
  Administered 2018-12-19: 10 mg via INTRAVENOUS
  Administered 2018-12-19: 50 mg via INTRAVENOUS
  Administered 2018-12-19: 10 mg via INTRAVENOUS
  Administered 2018-12-19: 20 mg via INTRAVENOUS

## 2018-12-19 MED ORDER — BUPIVACAINE-EPINEPHRINE (PF) 0.25% -1:200000 IJ SOLN
INTRAMUSCULAR | Status: AC
  Filled 2018-12-19: qty 30

## 2018-12-19 MED ORDER — MORPHINE SULFATE (PF) 2 MG/ML IV SOLN: 2.0000 mg | INTRAVENOUS | Status: DC | PRN

## 2018-12-19 MED ORDER — TRAMADOL HCL 50 MG PO TABS: 50.0000 mg | ORAL_TABLET | Freq: Four times a day (QID) | ORAL | 0 refills | Status: AC | PRN

## 2018-12-19 MED ORDER — BACITRACIN-NEOMYCIN-POLYMYXIN 400-5-5000 EX OINT: 1.0000 "application " | TOPICAL_OINTMENT | Freq: Three times a day (TID) | CUTANEOUS | Status: DC | PRN

## 2018-12-19 MED ORDER — SULFAMETHOXAZOLE-TRIMETHOPRIM 800-160 MG PO TABS: 1.0000 | ORAL_TABLET | Freq: Two times a day (BID) | ORAL | 0 refills | Status: AC

## 2018-12-19 MED ORDER — DEXAMETHASONE SODIUM PHOSPHATE 10 MG/ML IJ SOLN
INTRAMUSCULAR | Status: DC | PRN
  Administered 2018-12-19: 10 mg via INTRAVENOUS

## 2018-12-19 MED ORDER — DIPHENHYDRAMINE HCL 50 MG/ML IJ SOLN: 12.5000 mg | Freq: Four times a day (QID) | INTRAMUSCULAR | Status: DC | PRN

## 2018-12-19 MED ORDER — SODIUM CHLORIDE 0.9 % IR SOLN
Status: DC | PRN
  Administered 2018-12-19: 1000 mL

## 2018-12-19 MED ORDER — PROMETHAZINE HCL 25 MG/ML IJ SOLN: 6.2500 mg | INTRAMUSCULAR | Status: DC | PRN

## 2018-12-19 MED ORDER — KETOROLAC TROMETHAMINE 15 MG/ML IJ SOLN
15.0000 mg | Freq: Four times a day (QID) | INTRAMUSCULAR | Status: DC
  Administered 2018-12-19 – 2018-12-20 (×4): 15 mg via INTRAVENOUS
  Filled 2018-12-19 (×5): qty 1

## 2018-12-19 MED ORDER — ACETAMINOPHEN 325 MG PO TABS
650.0000 mg | ORAL_TABLET | ORAL | Status: DC | PRN
  Administered 2018-12-20: 10:00:00 650 mg via ORAL
  Filled 2018-12-19: qty 2

## 2018-12-19 MED ORDER — LIDOCAINE 2% (20 MG/ML) 5 ML SYRINGE
INTRAMUSCULAR | Status: AC
  Filled 2018-12-19: qty 5

## 2018-12-19 MED ORDER — SUFENTANIL CITRATE 50 MCG/ML IV SOLN
INTRAVENOUS | Status: DC | PRN
  Administered 2018-12-19 (×5): 10 ug via INTRAVENOUS

## 2018-12-19 MED ORDER — BUPIVACAINE-EPINEPHRINE 0.25% -1:200000 IJ SOLN
INTRAMUSCULAR | Status: DC | PRN
  Administered 2018-12-19: 30 mL

## 2018-12-19 MED ORDER — SUCCINYLCHOLINE CHLORIDE 200 MG/10ML IV SOSY
PREFILLED_SYRINGE | INTRAVENOUS | Status: AC
  Filled 2018-12-19: qty 10

## 2018-12-19 MED ORDER — ROCURONIUM BROMIDE 10 MG/ML (PF) SYRINGE
PREFILLED_SYRINGE | INTRAVENOUS | Status: AC
  Filled 2018-12-19: qty 10

## 2018-12-19 MED ORDER — ONDANSETRON HCL 4 MG/2ML IJ SOLN
INTRAMUSCULAR | Status: DC | PRN
  Administered 2018-12-19: 4 mg via INTRAVENOUS

## 2018-12-19 MED ORDER — SUCCINYLCHOLINE CHLORIDE 200 MG/10ML IV SOSY
PREFILLED_SYRINGE | INTRAVENOUS | Status: DC | PRN
  Administered 2018-12-19: 120 mg via INTRAVENOUS

## 2018-12-19 MED ORDER — KCL IN DEXTROSE-NACL 20-5-0.45 MEQ/L-%-% IV SOLN
INTRAVENOUS | Status: DC
  Administered 2018-12-19 – 2018-12-20 (×3): via INTRAVENOUS
  Filled 2018-12-19 (×4): qty 1000

## 2018-12-19 MED ORDER — SODIUM CHLORIDE (PF) 0.9 % IJ SOLN
INTRAMUSCULAR | Status: AC
  Filled 2018-12-19: qty 10

## 2018-12-19 MED ORDER — FLEET ENEMA 7-19 GM/118ML RE ENEM
1.0000 | ENEMA | Freq: Once | RECTAL | Status: DC
  Filled 2018-12-19: qty 1

## 2018-12-19 MED ORDER — LIDOCAINE 2% (20 MG/ML) 5 ML SYRINGE
INTRAMUSCULAR | Status: DC | PRN
  Administered 2018-12-19: 100 mg via INTRAVENOUS

## 2018-12-19 MED ORDER — EPHEDRINE 5 MG/ML INJ
INTRAVENOUS | Status: AC
  Filled 2018-12-19: qty 10

## 2018-12-19 MED ORDER — ONDANSETRON HCL 4 MG/2ML IJ SOLN
INTRAMUSCULAR | Status: AC
  Filled 2018-12-19: qty 2

## 2018-12-19 MED ORDER — MAGNESIUM CITRATE PO SOLN: 1.0000 | Freq: Once | ORAL | Status: DC

## 2018-12-19 MED ORDER — SCOPOLAMINE 1 MG/3DAYS TD PT72
1.0000 | MEDICATED_PATCH | TRANSDERMAL | Status: DC
  Administered 2018-12-19: 06:00:00 1.5 mg via TRANSDERMAL
  Filled 2018-12-19: qty 1

## 2018-12-19 MED ORDER — CEFAZOLIN SODIUM-DEXTROSE 1-4 GM/50ML-% IV SOLN
1.0000 g | Freq: Three times a day (TID) | INTRAVENOUS | Status: AC
  Administered 2018-12-19 – 2018-12-20 (×2): 1 g via INTRAVENOUS
  Filled 2018-12-19 (×3): qty 50

## 2018-12-19 MED ORDER — LACTATED RINGERS IV SOLN
INTRAVENOUS | Status: DC
  Administered 2018-12-19 (×3): via INTRAVENOUS

## 2018-12-19 MED ORDER — SODIUM CHLORIDE 0.9 % IV BOLUS
1000.0000 mL | Freq: Once | INTRAVENOUS | Status: AC
  Administered 2018-12-19: 11:00:00 1000 mL via INTRAVENOUS

## 2018-12-19 MED ORDER — DIPHENHYDRAMINE HCL 12.5 MG/5ML PO ELIX: 12.5000 mg | ORAL_SOLUTION | Freq: Four times a day (QID) | ORAL | Status: DC | PRN

## 2018-12-19 MED ORDER — SUGAMMADEX SODIUM 200 MG/2ML IV SOLN
INTRAVENOUS | Status: AC
  Filled 2018-12-19: qty 2

## 2018-12-19 MED ORDER — SUGAMMADEX SODIUM 200 MG/2ML IV SOLN
INTRAVENOUS | Status: DC | PRN
  Administered 2018-12-19: 170 mg via INTRAVENOUS

## 2018-12-19 MED ORDER — MIDAZOLAM HCL 2 MG/2ML IJ SOLN
INTRAMUSCULAR | Status: AC
  Filled 2018-12-19: qty 2

## 2018-12-19 MED ORDER — DOCUSATE SODIUM 100 MG PO CAPS
100.0000 mg | ORAL_CAPSULE | Freq: Two times a day (BID) | ORAL | Status: DC
  Administered 2018-12-19 – 2018-12-20 (×3): 100 mg via ORAL
  Filled 2018-12-19 (×3): qty 1

## 2018-12-19 MED ORDER — LACTATED RINGERS IV SOLN
INTRAVENOUS | Status: DC | PRN
  Administered 2018-12-19: 09:00:00

## 2018-12-19 MED ORDER — BELLADONNA ALKALOIDS-OPIUM 16.2-60 MG RE SUPP: 1.0000 | Freq: Four times a day (QID) | RECTAL | Status: DC | PRN

## 2018-12-19 MED ORDER — MIDAZOLAM HCL 2 MG/2ML IJ SOLN
INTRAMUSCULAR | Status: DC | PRN
  Administered 2018-12-19: 2 mg via INTRAVENOUS

## 2018-12-19 MED ORDER — DEXAMETHASONE SODIUM PHOSPHATE 10 MG/ML IJ SOLN
INTRAMUSCULAR | Status: AC
  Filled 2018-12-19: qty 1

## 2018-12-19 MED ORDER — HEPARIN SODIUM (PORCINE) 1000 UNIT/ML IJ SOLN
INTRAMUSCULAR | Status: AC
  Filled 2018-12-19: qty 1

## 2018-12-19 MED ORDER — SUFENTANIL CITRATE 50 MCG/ML IV SOLN
INTRAVENOUS | Status: AC
  Filled 2018-12-19: qty 1

## 2018-12-19 SURGICAL SUPPLY — 56 items
APPLICATOR COTTON TIP 6 STRL (MISCELLANEOUS) ×2 IMPLANT
APPLICATOR COTTON TIP 6IN STRL (MISCELLANEOUS) ×4
CATH FOLEY 2WAY SLVR 18FR 30CC (CATHETERS) ×4 IMPLANT
CATH ROBINSON RED A/P 16FR (CATHETERS) ×4 IMPLANT
CATH ROBINSON RED A/P 8FR (CATHETERS) ×4 IMPLANT
CATH TIEMANN FOLEY 18FR 5CC (CATHETERS) ×4 IMPLANT
CHLORAPREP W/TINT 26 (MISCELLANEOUS) ×4 IMPLANT
CLIP VESOLOCK LG 6/CT PURPLE (CLIP) ×12 IMPLANT
COVER SURGICAL LIGHT HANDLE (MISCELLANEOUS) ×4 IMPLANT
COVER TIP SHEARS 8 DVNC (MISCELLANEOUS) ×2 IMPLANT
COVER TIP SHEARS 8MM DA VINCI (MISCELLANEOUS) ×2
COVER WAND RF STERILE (DRAPES) IMPLANT
CUTTER ECHEON FLEX ENDO 45 340 (ENDOMECHANICALS) ×4 IMPLANT
DECANTER SPIKE VIAL GLASS SM (MISCELLANEOUS) ×4 IMPLANT
DERMABOND ADVANCED (GAUZE/BANDAGES/DRESSINGS) ×2
DERMABOND ADVANCED .7 DNX12 (GAUZE/BANDAGES/DRESSINGS) IMPLANT
DRAPE ARM DVNC X/XI (DISPOSABLE) ×8 IMPLANT
DRAPE COLUMN DVNC XI (DISPOSABLE) ×2 IMPLANT
DRAPE DA VINCI XI ARM (DISPOSABLE) ×8
DRAPE DA VINCI XI COLUMN (DISPOSABLE) ×2
DRAPE SURG IRRIG POUCH 19X23 (DRAPES) ×4 IMPLANT
DRSG TEGADERM 4X4.75 (GAUZE/BANDAGES/DRESSINGS) ×4 IMPLANT
ELECT REM PT RETURN 15FT ADLT (MISCELLANEOUS) ×4 IMPLANT
GLOVE BIO SURGEON STRL SZ 6.5 (GLOVE) ×3 IMPLANT
GLOVE BIO SURGEONS STRL SZ 6.5 (GLOVE) ×1
GLOVE BIOGEL M STRL SZ7.5 (GLOVE) ×8 IMPLANT
GOWN STRL REUS W/TWL LRG LVL3 (GOWN DISPOSABLE) ×14 IMPLANT
HEMOSTAT SURGICEL 2X3 (HEMOSTASIS) ×2 IMPLANT
HOLDER FOLEY CATH W/STRAP (MISCELLANEOUS) ×4 IMPLANT
IRRIG SUCT STRYKERFLOW 2 WTIP (MISCELLANEOUS) ×4
IRRIGATION SUCT STRKRFLW 2 WTP (MISCELLANEOUS) ×2 IMPLANT
KIT TURNOVER KIT A (KITS) ×2 IMPLANT
NDL SAFETY ECLIPSE 18X1.5 (NEEDLE) ×2 IMPLANT
NEEDLE HYPO 18GX1.5 SHARP (NEEDLE) ×2
PACK ROBOT UROLOGY CUSTOM (CUSTOM PROCEDURE TRAY) ×4 IMPLANT
RELOAD STAPLE 45 4.1 GRN THCK (STAPLE) ×2 IMPLANT
SEAL CANN UNIV 5-8 DVNC XI (MISCELLANEOUS) ×8 IMPLANT
SEAL XI 5MM-8MM UNIVERSAL (MISCELLANEOUS) ×8
SET IRRIG Y TYPE TUR BLADDER L (SET/KITS/TRAYS/PACK) ×2 IMPLANT
SOLUTION ELECTROLUBE (MISCELLANEOUS) ×4 IMPLANT
STAPLE RELOAD 45 GRN (STAPLE) ×2 IMPLANT
STAPLE RELOAD 45MM GREEN (STAPLE) ×2
SUT ETHILON 3 0 PS 1 (SUTURE) ×4 IMPLANT
SUT MNCRL 3 0 RB1 (SUTURE) ×2 IMPLANT
SUT MNCRL 3 0 VIOLET RB1 (SUTURE) ×2 IMPLANT
SUT MNCRL AB 4-0 PS2 18 (SUTURE) ×8 IMPLANT
SUT MONOCRYL 3 0 RB1 (SUTURE) ×4
SUT VIC AB 0 CT1 27 (SUTURE) ×2
SUT VIC AB 0 CT1 27XBRD ANTBC (SUTURE) ×2 IMPLANT
SUT VIC AB 0 UR5 27 (SUTURE) ×4 IMPLANT
SUT VIC AB 2-0 SH 27 (SUTURE) ×2
SUT VIC AB 2-0 SH 27X BRD (SUTURE) ×2 IMPLANT
SUT VICRYL 0 UR6 27IN ABS (SUTURE) ×8 IMPLANT
SYR 27GX1/2 1ML LL SAFETY (SYRINGE) ×4 IMPLANT
TOWEL OR 17X26 10 PK STRL BLUE (TOWEL DISPOSABLE) ×2 IMPLANT
TOWEL OR NON WOVEN STRL DISP B (DISPOSABLE) ×4 IMPLANT

## 2018-12-19 NOTE — Anesthesia Postprocedure Evaluation (Signed)
Anesthesia Post Note  Patient: James Gutierrez  Procedure(s) Performed: XI ROBOTIC ASSISTED LAPAROSCOPIC RADICAL PROSTATECTOMY LEVEL 2 (N/A ) LYMPHADENECTOMY, PELVIC (Bilateral )     Patient location during evaluation: PACU Anesthesia Type: General Level of consciousness: sedated Pain management: pain level controlled Vital Signs Assessment: post-procedure vital signs reviewed and stable Respiratory status: spontaneous breathing and respiratory function stable Cardiovascular status: stable Postop Assessment: no apparent nausea or vomiting Anesthetic complications: no    Last Vitals:  Vitals:   12/19/18 1130 12/19/18 1145  BP: 136/77 140/75  Pulse: 83 61  Resp: 16 13  Temp:    SpO2: 94% 96%    Last Pain:  Vitals:   12/19/18 1145  TempSrc:   PainSc: 0-No pain                 Kadeisha Betsch DANIEL

## 2018-12-19 NOTE — Progress Notes (Signed)
Called and updated wife Verdene Lennert pt was in recovery and doing well.

## 2018-12-19 NOTE — Discharge Instructions (Signed)

## 2018-12-19 NOTE — Transfer of Care (Signed)
Immediate Anesthesia Transfer of Care Note  Patient: James Gutierrez  Procedure(s) Performed: XI ROBOTIC ASSISTED LAPAROSCOPIC RADICAL PROSTATECTOMY LEVEL 2 (N/A ) LYMPHADENECTOMY, PELVIC (Bilateral )  Patient Location: PACU  Anesthesia Type:General  Level of Consciousness: awake and oriented  Airway & Oxygen Therapy: Patient Spontanous Breathing and Patient connected to face mask oxygen  Post-op Assessment: Report given to RN and Post -op Vital signs reviewed and stable  Post vital signs: Reviewed and stable  Last Vitals:  Vitals Value Taken Time  BP 137/85 12/19/2018 10:41 AM  Temp    Pulse 80 12/19/2018 10:43 AM  Resp 16 12/19/2018 10:43 AM  SpO2 100 % 12/19/2018 10:43 AM  Vitals shown include unvalidated device data.  Last Pain:  Vitals:   12/19/18 0544  TempSrc:   PainSc: 0-No pain      Patients Stated Pain Goal: 4 (35/00/93 8182)  Complications: No apparent anesthesia complications

## 2018-12-19 NOTE — Progress Notes (Signed)
Post-op note  Subjective: The patient is doing well.  No complaints. Denies N/V  Objective: Vital signs in last 24 hours: Temp:  [97.7 F (36.5 C)-98.1 F (36.7 C)] 97.7 F (36.5 C) (04/16 1042) Pulse Rate:  [61-83] 61 (04/16 1145) Resp:  [10-16] 13 (04/16 1145) BP: (132-140)/(69-85) 140/75 (04/16 1145) SpO2:  [94 %-100 %] 96 % (04/16 1145) Weight:  [77.6 kg] 77.6 kg (04/16 0544)  Intake/Output from previous day: No intake/output data recorded. Intake/Output this shift: Total I/O In: 2300 [I.V.:2200; IV Piggyback:100] Out: 440 [Urine:100; Drains:40; Blood:300]  Physical Exam:  General: Alert and oriented. Abdomen: Soft, Nondistended. Incisions: Clean and dry. Urine: clear/yellow  Lab Results: Recent Labs    12/19/18 1053  HGB 11.2*  HCT 34.4*    Assessment/Plan: POD#0   1) Continue to monitor  2) DVT prophy, clears, IS, amb, pain control   LOS: 0 days   Debbrah Alar 12/19/2018, 3:08 PM

## 2018-12-19 NOTE — Anesthesia Procedure Notes (Signed)
Procedure Name: Intubation Date/Time: 12/19/2018 7:27 AM Performed by: Sharlette Dense, CRNA Patient Re-evaluated:Patient Re-evaluated prior to induction Oxygen Delivery Method: Circle system utilized Preoxygenation: Pre-oxygenation with 100% oxygen Induction Type: IV induction and Rapid sequence Laryngoscope Size: Miller and 3 Grade View: Grade I Tube type: Oral Tube size: 8.0 mm Number of attempts: 1 Airway Equipment and Method: Stylet Placement Confirmation: ETT inserted through vocal cords under direct vision,  positive ETCO2 and breath sounds checked- equal and bilateral Secured at: 22 cm Tube secured with: Tape Dental Injury: Teeth and Oropharynx as per pre-operative assessment

## 2018-12-19 NOTE — Op Note (Signed)
Preoperative diagnosis: Clinically localized adenocarcinoma of the prostate (clinical stage T1c N0 M0)  Postoperative diagnosis: Clinically localized adenocarcinoma of the prostate (clinical stage T1c N0 M0)  Procedure:  1. Robotic assisted laparoscopic radical prostatectomy (bilateral nerve sparing) 2. Bilateral robotic assisted laparoscopic pelvic lymphadenectomy  Surgeon: Pryor Curia. M.D.  Assistant: Debbrah Alar, PA-C  An assistant was required for this surgical procedure.  The duties of the assistant included but were not limited to suctioning, passing suture, camera manipulation, retraction. This procedure would not be able to be performed without an Environmental consultant.  Resident: Dr. Basilio Cairo  Anesthesia: General  Complications: None  EBL: 300 mL  IVF:  2000 mL crystalloid  Specimens: 1. Prostate and seminal vesicles 2. Right pelvic lymph nodes 3. Left pelvic lymph nodes  Disposition of specimens: Pathology  Drains: 1. 20 Fr coude catheter 2. # 19 Blake pelvic drain  Indication: James Gutierrez is a 63 y.o. year old patient with clinically localized prostate cancer.  After a thorough review of the management options for treatment of prostate cancer, he elected to proceed with surgical therapy and the above procedure(s).  We have discussed the potential benefits and risks of the procedure, side effects of the proposed treatment, the likelihood of the patient achieving the goals of the procedure, and any potential problems that might occur during the procedure or recuperation. Informed consent has been obtained.  Description of procedure:  The patient was taken to the operating room and a general anesthetic was administered. He was given preoperative antibiotics, placed in the dorsal lithotomy position, and prepped and draped in the usual sterile fashion. Next a preoperative timeout was performed. A urethral catheter was placed into the bladder and a site was  selected near the umbilicus for placement of the camera port. This was placed using a standard open Hassan technique which allowed entry into the peritoneal cavity under direct vision and without difficulty. An 8 mm robotic port was placed and a pneumoperitoneum established. The camera was then used to inspect the abdomen and there was no evidence of any intra-abdominal injuries or other abnormalities. The remaining abdominal ports were then placed. 8 mm robotic ports were placed in the right lower quadrant, left lower quadrant, and far left lateral abdominal wall. A 5 mm port was placed in the right upper quadrant and a 12 mm port was placed in the right lateral abdominal wall for laparoscopic assistance. All ports were placed under direct vision without difficulty. The surgical cart was then docked.   Utilizing the cautery scissors, the bladder was reflected posteriorly allowing entry into the space of Retzius and identification of the endopelvic fascia and prostate. The periprostatic fat was then removed from the prostate allowing full exposure of the endopelvic fascia. The endopelvic fascia was then incised from the apex back to the base of the prostate bilaterally and the underlying levator muscle fibers were swept laterally off the prostate thereby isolating the dorsal venous complex. The dorsal vein was then stapled and divided with a 45 mm Flex Echelon stapler. Attention then turned to the bladder neck which was divided anteriorly thereby allowing entry into the bladder and exposure of the urethral catheter. The catheter balloon was deflated and the catheter was brought into the operative field and used to retract the prostate anteriorly. The posterior bladder neck was then examined and was divided allowing further dissection between the bladder and prostate posteriorly until the vasa deferentia and seminal vessels were identified. The vasa deferentia were isolated,  divided, and lifted anteriorly. The  seminal vesicles were dissected down to their tips with care to control the seminal vascular arterial blood supply. These structures were then lifted anteriorly and the space between Denonvillier's fascia and the anterior rectum was developed with a combination of sharp and blunt dissection. This isolated the vascular pedicles of the prostate.  The lateral prostatic fascia was then sharply incised allowing release of the neurovascular bundles bilaterally. The vascular pedicles of the prostate were then ligated with Weck clips between the prostate and neurovascular bundles and divided with sharp cold scissor dissection resulting in neurovascular bundle preservation. The neurovascular bundles were then separated off the apex of the prostate and urethra bilaterally.  The urethra was then sharply transected allowing the prostate specimen to be disarticulated. The pelvis was copiously irrigated and hemostasis was ensured. There was no evidence for rectal injury.  Attention then turned to the right pelvic sidewall. The fibrofatty tissue between the external iliac vein, confluence of the iliac vessels, hypogastric artery, and Cooper's ligament was dissected free from the pelvic sidewall with care to preserve the obturator nerve. Weck clips were used for lymphostasis and hemostasis. An identical procedure was performed on the contralateral side and the lymphatic packets were removed for permanent pathologic analysis.  Attention then turned to the urethral anastomosis. A 2-0 Vicryl slip knot was placed between Denonvillier's fascia, the posterior bladder neck, and the posterior urethra to reapproximate these structures. A double-armed 3-0 Monocryl suture was then used to perform a 360 running tension-free anastomosis between the bladder neck and urethra. A new urethral catheter was then placed into the bladder and irrigated. There were no blood clots within the bladder and the anastomosis appeared to be watertight.  A #19 Blake drain was then brought through the left lateral 8 mm port site and positioned appropriately within the pelvis. It was secured to the skin with a nylon suture. The surgical cart was then undocked. The right lateral 12 mm port site was closed at the fascial level with a 0 Vicryl suture placed laparoscopically. All remaining ports were then removed under direct vision. The prostate specimen was removed intact within the Endopouch retrieval bag via the periumbilical camera port site. This fascial opening was closed with two running 0 Vicryl sutures. 0.25% Marcaine was then injected into all port sites and all incisions were reapproximated at the skin level with 4-0 Monocryl subcuticular sutures and Dermabond. The patient appeared to tolerate the procedure well and without complications. The patient was able to be extubated and transferred to the recovery unit in satisfactory condition.   Pryor Curia MD

## 2018-12-20 ENCOUNTER — Encounter (HOSPITAL_COMMUNITY): Payer: Self-pay | Admitting: Urology

## 2018-12-20 DIAGNOSIS — C61 Malignant neoplasm of prostate: Secondary | ICD-10-CM | POA: Diagnosis not present

## 2018-12-20 LAB — HEMOGLOBIN AND HEMATOCRIT, BLOOD
HCT: 32.5 % — ABNORMAL LOW (ref 39.0–52.0)
Hemoglobin: 10.4 g/dL — ABNORMAL LOW (ref 13.0–17.0)

## 2018-12-20 MED ORDER — BISACODYL 10 MG RE SUPP
10.0000 mg | Freq: Once | RECTAL | Status: AC
  Administered 2018-12-20: 10:00:00 10 mg via RECTAL
  Filled 2018-12-20: qty 1

## 2018-12-20 MED ORDER — TRAMADOL HCL 50 MG PO TABS: 50.0000 mg | ORAL_TABLET | Freq: Four times a day (QID) | ORAL | Status: DC | PRN

## 2018-12-20 NOTE — Progress Notes (Signed)
Patient ID: James Gutierrez, male   DOB: 04/18/1956, 63 y.o.   MRN: [id number]  1 Day Post-Op Subjective: The patient is doing well.  No nausea or vomiting. Pain is adequately controlled.  Objective: Vital signs in last 24 hours: Temp:  [97.7 F (36.5 C)-98.9 F (37.2 C)] 98.9 F (37.2 C) (04/17 0425) Pulse Rate:  [59-83] 74 (04/17 0425) Resp:  [10-19] 16 (04/17 0425) BP: (119-153)/(64-85) 123/65 (04/17 0425) SpO2:  [85 %-100 %] 97 % (04/17 0425)  Intake/Output from previous day: 04/16 0701 - 04/17 0700 In: 4837.4 [P.O.:180; I.V.:4457.4; IV Piggyback:200] Out: 2940 [Urine:2425; Drains:215; Blood:300] Intake/Output this shift: No intake/output data recorded.  Physical Exam:  General: Alert and oriented. CV: RRR Lungs: Clear bilaterally. GI: Soft, Nondistended. Incisions: Clean, dry, and intact Urine: Clear Extremities: Nontender, no erythema, no edema.  Lab Results: Recent Labs    12/19/18 1053 12/20/18 0515  HGB 11.2* 10.4*  HCT 34.4* 32.5*      Assessment/Plan: POD# 1 s/p robotic prostatectomy.  1) SL IVF 2) Ambulate, Incentive spirometry 3) Transition to oral pain medication 4) Dulcolax suppository 5) D/C pelvic drain 6) Plan for likely discharge later today   Pryor Curia. MD   LOS: 0 days   Dutch Gray 12/20/2018, 7:50 AM

## 2018-12-20 NOTE — Discharge Summary (Signed)
  Date of admission: 12/19/2018  Date of discharge: 12/20/2018  Admission diagnosis: Prostate Cancer  Discharge diagnosis: Prostate Cancer  History and Physical: For full details, please see admission history and physical. Briefly, James Gutierrez is a 63 y.o. gentleman with localized prostate cancer.  After discussing management/treatment options, he elected to proceed with surgical treatment.  Hospital Course: James Gutierrez was taken to the operating room on 12/19/2018 and underwent a robotic assisted laparoscopic radical prostatectomy. He tolerated this procedure well and without complications. Postoperatively, he was able to be transferred to a regular hospital room following recovery from anesthesia.  He was able to begin ambulating the night of surgery. He remained hemodynamically stable overnight.  He had excellent urine output with appropriately minimal output from his pelvic drain and his pelvic drain was removed on POD #1.  He was transitioned to oral pain medication, tolerated a clear liquid diet, and had met all discharge criteria and was able to be discharged home later on POD#1.  Laboratory values:  Recent Labs    12/19/18 1053 12/20/18 0515  HGB 11.2* 10.4*  HCT 34.4* 32.5*    Disposition: Home  Discharge instruction: He was instructed to be ambulatory but to refrain from heavy lifting, strenuous activity, or driving. He was instructed on urethral catheter care.  Discharge medications:   Allergies as of 12/20/2018   No Known Allergies     Medication List    TAKE these medications   sulfamethoxazole-trimethoprim 800-160 MG tablet Commonly known as:  BACTRIM DS Take 1 tablet by mouth 2 (two) times daily. Start the day prior to foley removal appointment   traMADol 50 MG tablet Commonly known as:  Ultram Take 1-2 tablets (50-100 mg total) by mouth every 6 (six) hours as needed for moderate pain or severe pain.   TYLENOL PO Take by mouth.       Followup: He will  followup in 1 week for catheter removal and to discuss his surgical pathology results.

## 2018-12-20 NOTE — Progress Notes (Signed)
Provided pt with education on home catheter use and care of incision. Provided Pt with supplies to go home with for catheter care. Pt showed understanding.

## 2018-12-31 ENCOUNTER — Telehealth: Payer: Self-pay | Admitting: Medical Oncology

## 2018-12-31 NOTE — Telephone Encounter (Signed)
Left message as follow up to robotic prostatectomy 12/19/18. I informed him I am working remotely due to COVID-19 but he can leave me a message on my office phone and I will return his call. I tole him I hope his recovery is going well and to please reach out to me if I can answer questions or assist him in any way.

## 2019-04-15 ENCOUNTER — Other Ambulatory Visit: Payer: Self-pay

## 2019-04-15 ENCOUNTER — Encounter: Payer: Self-pay | Admitting: Urology

## 2019-04-15 ENCOUNTER — Ambulatory Visit
Admission: RE | Admit: 2019-04-15 | Discharge: 2019-04-15 | Disposition: A | Discharge status: Home / Self Care | Payer: PRIVATE HEALTH INSURANCE | Source: Ambulatory Visit | Attending: Radiation Oncology | Admitting: Radiation Oncology

## 2019-04-15 ENCOUNTER — Ambulatory Visit
Admission: RE | Admit: 2019-04-15 | Discharge: 2019-04-15 | Disposition: A | Discharge status: Home / Self Care | Payer: PRIVATE HEALTH INSURANCE | Source: Ambulatory Visit | Attending: Urology | Admitting: Urology

## 2019-04-15 ENCOUNTER — Telehealth: Payer: Self-pay | Admitting: *Deleted

## 2019-04-15 DIAGNOSIS — C61 Malignant neoplasm of prostate: Secondary | ICD-10-CM

## 2019-04-15 NOTE — Progress Notes (Signed)
Radiation Oncology         [phone] ________________________________  Outpatient Follow Up New Visit - Conducted via telephone due to current COVID-19 concerns for limiting patient exposure  Name: James Gutierrez MRN: [id number]  Date: 04/15/2019  DOB: 1956/05/31  JG:GEZMOQH, Rolland Porter, MD   REFERRING PHYSICIAN: Raynelle Bring, MD  DIAGNOSIS: 63 y.o. gentleman with prostate cancer s/p RALP for T3bN0Mx, Gleason 4+5 adenocarcinoma of the prostate with a positive margin at the left apex on final pathology but postoperative PSA is undetectable.    ICD-10-CM   1. Malignant neoplasm of prostate ([place])  C61     HISTORY OF PRESENT ILLNESS (10/18/2018)::James Gutierrez is a 63 y.o. gentleman.  He was noted to have an elevated PSA of 4.23 in 06/2018 by his primary care provider, Marilynne Drivers, [place].  Prior PSA was 2.24 in 04/2016.  Accordingly, he was referred for evaluation in urology by Dr. Jeffie Pollock on 07/29/2018,  digital rectal examination was performed at that time revealing symmetrical prostate lobes without discrete nodularity.  The patient proceeded to transrectal ultrasound with 12 biopsies of the prostate on 08/19/2018.  The prostate volume measured 23 cc.  Out of 12 core biopsies, 3 were positive.  The maximum Gleason score was 4+4, and this was seen in the left apex lateral, left base, and left apex.  He underwent CT abdomen/pelvis on 09/16/2018 for disease staging, and this scan did not demonstrate any findings highly suspicious for metastatic disease in the abdomen or pelvis and no lymphadenopathy. He also had a bone scan that same day, with results showing no scintigraphic evidence of osseous metastatic disease.  We initially met him in 10/2018 in the multidisciplinary prostate cancer clinic for discussion of potential radiation treatment options and clinical evaluation. He ultimately elected to proceed with RALP as per consensus recommendation.  INTERVAL HISTORY  (04/15/2019):  He underwent UNS robotic radical prostatectomy with bilateral pelvic lymphadenectomy on 12/19/2018 with Dr. Alinda Money. Final pathology revealed Gleason 4+5 prostatic adenocarcinoma, extensively involving the left lobe from the apex to the base, with extraprostatic extension present at the left posterior base, left seminal vesicle invasion (pT3b), as well as lymphovascular invasion with a positive margin of resection at the left apex (3 mm, Gleason 4+5). Other resection margins were negative and 5 lymph nodes were benign (0/5).   Initial post-operative PSA on 03/25/2019 was undectectable. The patient reviewed pathology results with Dr. Alinda Money and due to his high-risk factors for recurrence, he has been referred back today for discussion of adjuvant radiation treatment.   PREVIOUS RADIATION THERAPY: No  PAST MEDICAL HISTORY:  has a past medical history of Asthma, ED (erectile dysfunction), Emphysema lung ([place]), Hypercholesterolemia, Hyperplasia of prostate with lower urinary tract symptoms (LUTS), and Prostate cancer ([place]).    PAST SURGICAL HISTORY: Past Surgical History:  Procedure Laterality Date   COLONOSCOPY      aprox 5 years ago   lt thumb surgery     LYMPHADENECTOMY Bilateral 12/19/2018   Procedure: LYMPHADENECTOMY, PELVIC;  Surgeon: Raynelle Bring, MD;  Location: [facility];  Service: Urology;  Laterality: Bilateral;   ORIF FINGER FRACTURE  07/21/2011   Procedure: OPEN REDUCTION INTERNAL FIXATION (ORIF) METACARPAL (FINGER) FRACTURE;  Surgeon: Tennis Must;  Location: [facility];  Service: Orthopedics;  Laterality: Left;  left thumb laceration    PROSTATE BIOPSY  08-19-2018   dr Jeffie Pollock office   ROBOT ASSISTED LAPAROSCOPIC RADICAL PROSTATECTOMY N/A 12/19/2018   Procedure: XI ROBOTIC ASSISTED LAPAROSCOPIC RADICAL PROSTATECTOMY LEVEL  2;  Surgeon: Raynelle Bring, MD;  Location: [facility];  Service: Urology;  Laterality: N/A;    FAMILY HISTORY: family history includes Lung cancer in his  father; Prostate cancer in his paternal grandfather.  SOCIAL HISTORY:  reports that he quit smoking about 6 months ago. His smoking use included cigarettes. He has a 43.00 pack-year smoking history. He has never used smokeless tobacco. He reports previous alcohol use. He reports that he does not use drugs.  ALLERGIES: Patient has no known allergies.  MEDICATIONS:  Current Outpatient Medications  Medication Sig Dispense Refill   sildenafil (VIAGRA) 100 MG tablet TAKE 1 TABLET BY MOUTH AS DIRECTED AS NEEDED     Acetaminophen (TYLENOL PO) Take by mouth.     sulfamethoxazole-trimethoprim (BACTRIM DS,SEPTRA DS) 800-160 MG tablet Take 1 tablet by mouth 2 (two) times daily. Start the day prior to foley removal appointment (Patient not taking: Reported on 04/15/2019) 6 tablet 0   traMADol (ULTRAM) 50 MG tablet Take 1-2 tablets (50-100 mg total) by mouth every 6 (six) hours as needed for moderate pain or severe pain. (Patient not taking: Reported on 04/15/2019) 20 tablet 0   No current facility-administered medications for this encounter.     REVIEW OF SYSTEMS:  On review of systems, the patient reports that he is doing well overall. Per patient form, he reports wearing glasses, dental problems (dentures), and back pain. He denies any chest pain, shortness of breath, cough, fevers, chills, night sweats, unintended weight changes. He denies any bowel disturbances, and denies abdominal pain, nausea or vomiting. He denies any new musculoskeletal or joint aches or pains. His IPSS was 14, indicating moderate urinary symptoms but he reports that he has regained full bladder control. He has continued to wear a liner in his shorts for protection but this remains dry throughout the day and night at this point.  His SHIM was 19, indicating he has mild erectile dysfunction. A complete review of systems is obtained and is otherwise negative.   PHYSICAL EXAM: Not performed in light of telephone follow up visit  format.   Wt Readings from Last 3 Encounters:  12/19/18 171 lb 1.6 oz (77.6 kg)  12/16/18 171 lb 1.6 oz (77.6 kg)  10/18/18 168 lb 6.4 oz (76.4 kg)   Temp Readings from Last 3 Encounters:  12/20/18 97.9 F (36.6 C) (Oral)  12/16/18 97.6 F (36.4 C) (Oral)  10/18/18 98.4 F (36.9 C) (Oral)   BP Readings from Last 3 Encounters:  12/20/18 136/75  12/16/18 140/77  10/18/18 (!) 143/77   Pulse Readings from Last 3 Encounters:  12/20/18 62  12/16/18 69  10/18/18 64    LABORATORY DATA:  Lab Results  Component Value Date   WBC 8.2 12/16/2018   HGB 10.4 (L) 12/20/2018   HCT 32.5 (L) 12/20/2018   MCV 85.2 12/16/2018   PLT 363 12/16/2018   Lab Results  Component Value Date   NA 139 12/16/2018   K 5.0 12/16/2018   CL 107 12/16/2018   CO2 25 12/16/2018   No results found for: ALT, AST, GGT, ALKPHOS, BILITOT   RADIOGRAPHY: No results found.    IMPRESSION/PLAN: 63 y.o. gentleman with prostate cancer s/p RALP for T3bN0Mx, Gleason 4+5 adenocarcinoma of the prostate with a positive margin at the left apex on final pathology but postoperative PSA is undetectable.  Today we reviewed the findings and workup thus far.  We discussed the natural history of prostate cancer.  We reviewed the the implications of positive  margins, extracapsular extension, and seminal vesicle involvement on the risk of prostate cancer recurrence. We reviewed some of the evidence suggesting an advantage for patients who undergo adjuvant radiotherapy in the setting in terms of disease control and overall survival. We also discussed some of the dilemmas related to the available evidence.  We discussed the SWOG trial which did show an improvement in disease-free survival as well as overall survival using adjuvant radiotherapy. However, we discussed the fact that the study did not carefully control the usage of adjuvant radiotherapy in the observation arm. There is increasing evidence that careful surveillance with  ultrasensitive PSA may provide an opportunity for early salvage in patients who undergo observation, which can lead to excellent results in terms of disease control and survival. We discussed radiation treatment directed to the prostatic fossa with regard to the logistics and delivery of external beam radiation treatment. He was encouraged to ask questions that were answered to his stated satisfaction.  At the conclusion of our conversation, the patient would like to proceed with PSA surveillance in 3 months time, prior to his scheduled follow up visit in 09/2019.  We will share our findings with Dr. Alinda Money and will look forward to following along in his progress.     We enjoyed meeting with him today, and will look forward to participating in the care of this very nice gentleman.   Given current concerns for patient exposure during the COVID-19 pandemic, this encounter was conducted via telephone. The patient was notified in advance and was offered a [place] meeting to allow for face to face communication but unfortunately reported that he did not have the appropriate resources/technology to support such a visit and instead preferred to proceed with telephone consult. The patient has given verbal consent for this type of encounter. The time spent during this encounter was 40 minutes. The attendants for this meeting include Tyler Pita MD, Westlyn Glaza PA-C, and patient, Nik Gorrell. During the encounter, Tyler Pita MD and Freeman Caldron PA-C were located at [facility] Radiation Oncology Department.  Patient, James Gutierrez was located at home.    Nicholos Johns, PA-C    Tyler Pita, MD  [facility] Direct Dial: [phone]   Fax: [phone] [email or web address]   Skype   [email or web address]  This document serves as a record of services personally performed by Tyler Pita, MD and Freeman Caldron, PA-C. It was created on their behalf by Rae Lips, a trained medical scribe. The creation of this record is based on the scribe's personal observations and the providers' statements to them. This document has been checked and approved by the attending providers.  References:  JAMA. 2006 Nov 15;296(19):2329-35.  Adjuvant radiotherapy for pathologically advanced prostate cancer: a randomized clinical trial.  Grandville Silos IM Jr(1), Tangen CM, Hettie Holstein MS, Ova Freshwater, Messing Wayland Denis ED.  Author information: (1)Department of Urology, [facility] [place], [place], [address] [place] [address], [place]. [email or web address]  CONTEXT: Despite a stage-shift to earlier cancer stages and lower tumor volumes for prostate cancer, pathologically advanced disease is detected at radical prostatectomy in 38% to 52% of patients. However, the optimal management of these patients after radical prostatectomy is unknown.  OBJECTIVE: To determine whether adjuvant radiotherapy improves metastasis-free survival in patients with stage pT3 N0 M0 prostate cancer.  DESIGN, SETTING, AND PATIENTS: Randomized, prospective, multi-institutional, [place] clinical  trial with enrollment between April 19, 1987, and September 05, 1995 (with database frozen for statistical analysis on May 25, 2004). Patients were 42 men with pathologically advanced prostate cancer who had undergone radical prostatectomy. INTERVENTION: Men were randomly assigned to receive 60 to 64 Gy of external beam radiotherapy delivered to the prostatic fossa (n = 214) or usual care plus observation (n = 211).  MAIN OUTCOME MEASURES: Primary outcome was metastasis-free survival, defined as time to first occurrence of metastatic disease or death due to any cause. Secondary outcomes included prostate-specific antigen (PSA) relapse, recurrence-free survival, overall survival, freedom from hormonal  therapy, and postoperative complications.  RESULTS: Among the 425 men, median follow-up was 10.6 years (interquartile range, 9.2-12.7 years). For metastasis-free survival, 76 (35.5%) of 214 men in the adjuvant radiotherapy group were diagnosed with metastatic disease or died (median metastasis-free estimate, 14.7 years), compared with 91 (43.1%) of 211 (median metastasis-free estimate, 13.2 years) of those in the observation group (hazard ratio [HR], 0.75; 95% CI, 0.55-1.02; P = .06). There were no significant between-group differences for overall survival (71 deaths, median survival of 14.7 years for radiotherapy vs 83 deaths, median survival of 13.8 years for observation; HR, 0.80; 95% CI, 0.58-1.09; P = .16). PSA relapse (median PSA relapse-free survival, 10.3 years for radiotherapy vs 3.1 years for observation; HR, 0.43; 95% CI, 0.31-0.58; P<.001) and disease recurrence (median recurrence-free survival, 13.8 years for radiotherapy vs 9.9 years for observation; HR, 0.62; 95% CI, 0.46-0.82; P = .001) were both significantly reduced with radiotherapy. Adverse effects were more common with radiotherapy vs observation (23.8% vs 11.9%), including rectal complications (1.6% vs 0%), urethral strictures (17.8% vs 9.5%), and total urinary incontinence (6.5% vs 2.8%).  CONCLUSIONS: In men who had undergone radical prostatectomy for pathologically advanced prostate cancer, adjuvant radiotherapy resulted in significantly reduced risk of PSA relapse and disease recurrence, although the improvements in metastasis-free survival and overall survival were not statistically significant.  Trial Registration clinicaltrials.gov Identifier: [id number]. PMID: [id number]   J Clin Oncol. 2007 Jun 1;25(16):2225-9. Predominant treatment failure in postprostatectomy patients is local: analysis of patterns of treatment failure in SWOG 8794.  Swanson GP(1), Munson MA, Tangen CM, Chin J, Messing E, Canby-Hagino Daiva Eves, Doy Hutching ED;  Author information: (1)Department of Radiation Oncology and Urology, [facility], [place] [address], [place]. [email or web address] Comment in J Clin Oncol. 2007 Dec 10;25(35):5671-2.  PURPOSE: [employer] [facility] (SWOG) trial [id number] demonstrated that adjuvant radiation reduces the risk of biochemical (prostate-specific antigen [PSA]) treatment failure by 50% over radical prostatectomy alone. In this analysis, we stratified patients as to their preradiation PSA levels and correlated it with outcomes such as PSA treatment failure, local recurrence, and distant failure, to serve as guidelines for future research.  PATIENTS AND METHODS: Four hundred thirty-one subjects with pathologically advanced prostate cancer (extraprostatic extension, positive surgical margins, or seminal vesicle invasion) were randomly assigned to adjuvant radiotherapy or observation.  RESULTS: Three hundred seventy-four eligible patients had immediate postprostatectomy and follow-up PSA data. Median follow-up was 10.2 years. For patients with a postsurgical PSA of 0.2 ng/mL, radiation was associated with reductions in the 10-year risk of biochemical treatment failure (72% to 42%), local failures (20% to 7%), and distant failures (12% to 4%). For patients with a postsurgical PSA between higher than 0.2 and <or = 1.0 ng/mL, reductions in the 10-year risk of biochemical failure (80% to 73%), local failures (25% to 9%), and distant failures (16% to 12%) were realized.  In patients with postsurgical PSA higher than 1.0, the respective findings were 94% versus 100%, 28% versus 9%, and 44% versus 18%.  CONCLUSION: The pattern of treatment failure in high-risk patients is predominantly local with a surprisingly low incidence of metastatic failure. Adjuvant radiation to the prostate bed reduces the risk of metastatic disease and biochemical failure at all postsurgical PSA levels. Further improvement  in reducing local treatment failure is likely to have the greatest impact on outcome in high-risk patients after prostatectomy.  PMID: [id number]     J Urol. 2009 Mar;181(3):956-62.  Adjuvant radiotherapy for pathological T3N0M0 prostate cancer significantly reduces risk of metastases and improves survival: long-term followup of a randomized clinical trial.  Grandville Silos IM(1), Tangen CM, Hettie Holstein MS, Ova Freshwater, Messing Wayland Denis ED.  Chief Strategy Officer information: (1)[employer] [facility] [employer] [facility] [place], [place], [place], [place]. PURPOSE: Extraprostatic disease will be manifest in a third of men after radical prostatectomy. We present the long-term followup of a randomized clinical trial of radiotherapy to reduce the risk of subsequent metastatic disease and death.  MATERIALS AND METHODS: A total of 431 men with pT3N0M0 prostate cancer were randomized to 60 to 64 Gy adjuvant radiotherapy or observation. The primary study end point was metastasis-free survival.  RESULTS: Of 425 eligible men 211 were randomized to observation and 214 to adjuvant radiation. Of those men under observation 70 ultimately received radiotherapy. Metastasis-free survival was significantly greater with radiotherapy (93 of 214 events on the radiotherapy arm vs 114 of 211 events on observation; HR 0.71; 95% CI 0.54, 0.94; p = 0.016). Survival improved significantly with adjuvant radiation (88 deaths of 214 on the radiotherapy arm vs 110 deaths of 211 on observation; HR 0.72; 95% CI 0.55, 0.96; p = 0.023).  CONCLUSIONS: Adjuvant radiotherapy after radical prostatectomy for a man with pT3N0M0 prostate cancer significantly reduces the risk of metastasis and increases survival.  PMCID: [id number] PMID: [id number]

## 2019-04-15 NOTE — Telephone Encounter (Signed)
CALLED PATIENT TO INFORM OF LAB (PSA) ON 06-17-19 - ARRIVAL TIME- 11 AM @ DR. BORDEN'S OFFICE, SPOKE WITH PATIENT AND HE IS AWARE OF THIS APPT.

## 2019-04-27 IMAGING — NM NM BONE WHOLE BODY
2 series · 2 of 2 positions shown · non-contrast
Comparison: None

Correlation: CT abdomen and pelvis 09/16/2018

CLINICAL DATA: Prostate cancer, PSA = 4.49 on 08/08/2018

EXAM:
NUCLEAR MEDICINE WHOLE BODY BONE SCAN
TECHNIQUE: Whole body anterior and posterior images were obtained approximately
3 hours after intravenous injection of radiopharmaceutical.
RADIOPHARMACEUTICALS:  20.2 mCi Iechnetium-55m MDP IV

[Series 1: whole body · 2.66mm/px · 1 of 1 slices shown (1 of 2)]
[im 1/1]
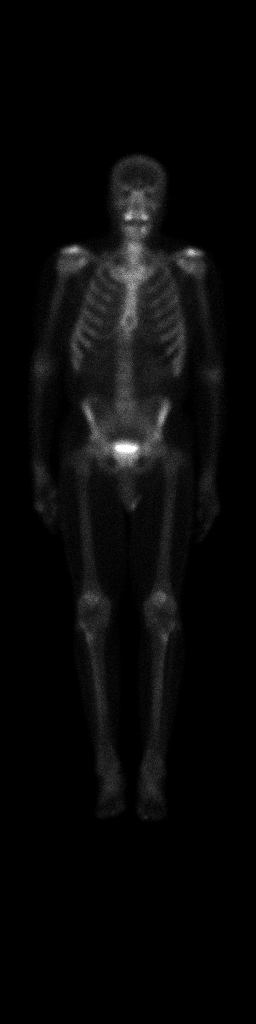

[Series 1: whole body · 2.66mm/px · 1 of 1 slices shown (2 of 2)]
[im 1/1]
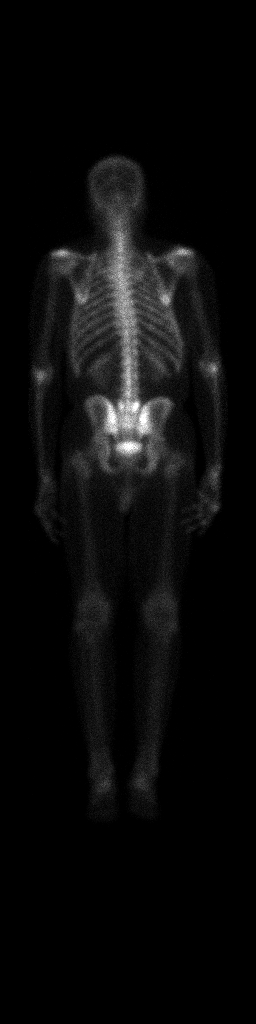

[2 of 2 positions shown; findings below may reference images not displayed]

FINDINGS: Uptake at the maxilla bilaterally and LEFT mandible, likely dental.

Minimal uptake at the shoulders and knees typically degenerative.

Uptake at the lateral aspects of lower lumbar spine bilaterally at
L5-S1 corresponding to facet degenerative changes on CT.

No definite sites of abnormal tracer accumulation are seen which are
worrisome for osseous metastatic disease.

Expected urinary tract and soft tissue distribution of tracer.
IMPRESSION: No scintigraphic evidence of osseous metastatic disease.

## 2021-03-05 ENCOUNTER — Other Ambulatory Visit: Payer: Self-pay | Admitting: Urology

## 2021-05-08 ENCOUNTER — Other Ambulatory Visit: Payer: Self-pay | Admitting: Urology

## 2021-07-01 ENCOUNTER — Other Ambulatory Visit: Payer: Self-pay | Admitting: Urology

## 2022-11-30 DIAGNOSIS — R030 Elevated blood-pressure reading, without diagnosis of hypertension: Secondary | ICD-10-CM | POA: Diagnosis not present

## 2022-11-30 DIAGNOSIS — E7800 Pure hypercholesterolemia, unspecified: Secondary | ICD-10-CM | POA: Diagnosis not present

## 2023-06-12 DIAGNOSIS — R030 Elevated blood-pressure reading, without diagnosis of hypertension: Secondary | ICD-10-CM | POA: Diagnosis not present

## 2023-06-12 DIAGNOSIS — E7800 Pure hypercholesterolemia, unspecified: Secondary | ICD-10-CM | POA: Diagnosis not present

## 2023-08-17 DIAGNOSIS — L0291 Cutaneous abscess, unspecified: Secondary | ICD-10-CM | POA: Diagnosis not present

## 2023-10-05 DIAGNOSIS — E7800 Pure hypercholesterolemia, unspecified: Secondary | ICD-10-CM | POA: Diagnosis not present

## 2023-10-05 DIAGNOSIS — J439 Emphysema, unspecified: Secondary | ICD-10-CM | POA: Diagnosis not present

## 2023-10-05 DIAGNOSIS — Z23 Encounter for immunization: Secondary | ICD-10-CM | POA: Diagnosis not present

## 2023-10-05 DIAGNOSIS — Z0000 Encounter for general adult medical examination without abnormal findings: Secondary | ICD-10-CM | POA: Diagnosis not present

## 2023-10-05 DIAGNOSIS — I10 Essential (primary) hypertension: Secondary | ICD-10-CM | POA: Diagnosis not present

## 2024-01-25 DIAGNOSIS — R040 Epistaxis: Secondary | ICD-10-CM | POA: Diagnosis not present

## 2024-01-25 DIAGNOSIS — J309 Allergic rhinitis, unspecified: Secondary | ICD-10-CM | POA: Diagnosis not present

## 2024-03-03 DIAGNOSIS — E7800 Pure hypercholesterolemia, unspecified: Secondary | ICD-10-CM | POA: Diagnosis not present

## 2024-03-03 DIAGNOSIS — J439 Emphysema, unspecified: Secondary | ICD-10-CM | POA: Diagnosis not present

## 2024-03-03 DIAGNOSIS — I10 Essential (primary) hypertension: Secondary | ICD-10-CM | POA: Diagnosis not present

## 2024-04-03 DIAGNOSIS — E7800 Pure hypercholesterolemia, unspecified: Secondary | ICD-10-CM | POA: Diagnosis not present

## 2024-04-03 DIAGNOSIS — J439 Emphysema, unspecified: Secondary | ICD-10-CM | POA: Diagnosis not present

## 2024-04-03 DIAGNOSIS — I10 Essential (primary) hypertension: Secondary | ICD-10-CM | POA: Diagnosis not present

## 2024-05-04 DIAGNOSIS — J439 Emphysema, unspecified: Secondary | ICD-10-CM | POA: Diagnosis not present

## 2024-05-04 DIAGNOSIS — E7800 Pure hypercholesterolemia, unspecified: Secondary | ICD-10-CM | POA: Diagnosis not present

## 2024-05-04 DIAGNOSIS — I10 Essential (primary) hypertension: Secondary | ICD-10-CM | POA: Diagnosis not present

## 2024-05-29 DIAGNOSIS — J988 Other specified respiratory disorders: Secondary | ICD-10-CM | POA: Diagnosis not present

## 2024-06-03 DIAGNOSIS — J439 Emphysema, unspecified: Secondary | ICD-10-CM | POA: Diagnosis not present

## 2024-06-03 DIAGNOSIS — E7800 Pure hypercholesterolemia, unspecified: Secondary | ICD-10-CM | POA: Diagnosis not present

## 2024-06-03 DIAGNOSIS — I10 Essential (primary) hypertension: Secondary | ICD-10-CM | POA: Diagnosis not present

## 2024-06-19 NOTE — Progress Notes (Signed)
 New Patient Pulmonology Office Visit   Subjective:  Patient ID: James Gutierrez, male    DOB: February 11, 1956  MRN: [id number]  Referred by: No ref. provider found  CC:  Chief Complaint  Patient presents with   Consult    Pt c/o SOB with exertion, much improved after course of azithromycin and prednisone. Pt states he is no longer coughing. Pt states he was referred here because of his former smoking.    HPI James Gutierrez is a 68 y.o. male with past medical history of childhood asthma, former smoker (40 years, 1ppd, quitted in 2020), who came for evaluation of COPD. Patient report wheezing throughout the day, mucus production (sometimes 1/2 pack), baseline cough and dyspnea with moderate exertion.  His PCP prescribed recently azithromycin and prednisone for 5 days with complete resolution of his symptoms.  He is able to walk 7000 steps without difficulty.  He denied to have any symptoms at this time.  He has not used albuterol inhaler.  He denied to use daily inhaler in the past. No admissions related to COPD/asthma.  In 2020 he had CT abdomen and PET scan for prostate cancer, s/p resection, and it shows some emphysema.   ROS as above  Allergies: Patient has no known allergies.  Current Outpatient Medications:    Acetaminophen  (TYLENOL  PO), Take by mouth., Disp: , Rfl:    sildenafil (VIAGRA) 100 MG tablet, TAKE 1 TABLET BY MOUTH ONCE DAILY AS NEEDED, Disp: 30 tablet, Rfl: 0   sulfamethoxazole -trimethoprim  (BACTRIM  DS,SEPTRA  DS) 800-160 MG tablet, Take 1 tablet by mouth 2 (two) times daily. Start the day prior to foley removal appointment (Patient not taking: Reported on 04/15/2019), Disp: 6 tablet, Rfl: 0   traMADol  (ULTRAM ) 50 MG tablet, Take 1-2 tablets (50-100 mg total) by mouth every 6 (six) hours as needed for moderate pain or severe pain. (Patient not taking: Reported on 04/15/2019), Disp: 20 tablet, Rfl: 0 Past Medical History:  Diagnosis Date   Asthma    as a child   ED  (erectile dysfunction)    Emphysema lung (HCC)    Ct scan done 09/2018   Hypercholesterolemia    Hyperplasia of prostate with lower urinary tract symptoms (LUTS)    Prostate cancer (HCC)    dx 08-19-2018  Stage T1c, Gleason 4+4   Past Surgical History:  Procedure Laterality Date   COLONOSCOPY      aprox 5 years ago   lt thumb surgery     LYMPHADENECTOMY Bilateral 12/19/2018   Procedure: LYMPHADENECTOMY, PELVIC;  Surgeon: Renda Glance, MD;  Location: [facility];  Service: Urology;  Laterality: Bilateral;   ORIF FINGER FRACTURE  07/21/2011   Procedure: OPEN REDUCTION INTERNAL FIXATION (ORIF) METACARPAL (FINGER) FRACTURE;  Surgeon: Franky JONELLE Curia;  Location: [facility];  Service: Orthopedics;  Laterality: Left;  left thumb laceration    PROSTATE BIOPSY  08-19-2018   dr watt office   ROBOT ASSISTED LAPAROSCOPIC RADICAL PROSTATECTOMY N/A 12/19/2018   Procedure: XI ROBOTIC ASSISTED LAPAROSCOPIC RADICAL PROSTATECTOMY LEVEL 2;  Surgeon: Renda Glance, MD;  Location: [facility];  Service: Urology;  Laterality: N/A;   Family History  Problem Relation Age of Onset   Lung cancer Father    Prostate cancer Paternal Grandfather    Social History   Socioeconomic History   Marital status: Married    Spouse name: Lucienne   Number of children: 2   Years of education: Not on file   Highest education level: Not on file  Occupational History  Comment: switcher  Tobacco Use   Smoking status: Former    Current packs/day: 0.00    Average packs/day: 1 pack/day for 43.0 years (43.0 ttl pk-yrs)    Types: Cigarettes    Start date: 09/26/1975    Quit date: 09/25/2018    Years since quitting: 5.7   Smokeless tobacco: Never  Vaping Use   Vaping status: Never Used  Substance and Sexual Activity   Alcohol use: Not Currently    Comment: social last 09/2018   Drug use: No   Sexual activity: Yes  Other Topics Concern   Not on file  Social History Narrative   Not on file   Social Drivers of Health    Financial Resource Strain: Not on file  Food Insecurity: Not on file  Transportation Needs: Not on file  Physical Activity: Not on file  Stress: Not on file  Social Connections: Not on file  Intimate Partner Violence: Not on file    Patient used to smoke 1 pack a day for 40 years, quitted in 2020.    Objective:  There were no vitals taken for this visit. Wt Readings from Last 3 Encounters:  06/20/24 189 lb 12.8 oz (86.1 kg)  12/19/18 171 lb 1.6 oz (77.6 kg)  12/16/18 171 lb 1.6 oz (77.6 kg)   SpO2 Readings from Last 3 Encounters:  06/20/24 98%  12/20/18 98%  12/16/18 100%    Physical Exam Constitutional:      Appearance: Normal appearance.  HENT:     Head: Normocephalic and atraumatic.     Mouth/Throat:     Mouth: Mucous membranes are moist.  Eyes:     Extraocular Movements: Extraocular movements intact.     Pupils: Pupils are equal, round, and reactive to light.  Cardiovascular:     Rate and Rhythm: Normal rate and regular rhythm.  Pulmonary:     Effort: Pulmonary effort is normal.     Breath sounds: Normal breath sounds.  Musculoskeletal:        General: Normal range of motion.  Neurological:     General: No focal deficit present.     Mental Status: He is alert and oriented to person, place, and time.       Assessment & Plan:   Assessment & Plan COPD with asthma (HCC) 68 years old with history of asthma, former smoker, who has symptoms of wheezing, cough and chronic sputum productions.  His symptoms have resolved after a short course of prednisone and azithromycin.  He denied to use inhalers in the past.  He is able to to walk 7000 steps a day without difficulty. Based on his symptoms I think he should be better controlled with long-term inhalers as Advair BID to avoid flares in the future and improve his symptoms. I explained inhaler technique and instructions.  Plan: Start Advair BID Albuterol as need it PFTs  Orders:   Pulmonary Function Test;  Future   Ambulatory Referral for Lung Cancer Scre  Former smoker Patient had 40-year history.  He quit smoking in 2020.  Patient meets criteria for lung cancer screening.  Referral was placed today.       Return in about 4 months (around 10/21/2024).    Marny Patch, MD Pulmonary and Critical Care Medicine [facility]

## 2024-06-20 ENCOUNTER — Ambulatory Visit (INDEPENDENT_AMBULATORY_CARE_PROVIDER_SITE_OTHER): Payer: PRIVATE HEALTH INSURANCE

## 2024-06-20 VITALS — BP 160/82 | HR 79 | Ht 68.0 in | Wt 189.8 lb

## 2024-06-20 DIAGNOSIS — J4489 Other specified chronic obstructive pulmonary disease: Secondary | ICD-10-CM

## 2024-06-20 DIAGNOSIS — Z87891 Personal history of nicotine dependence: Secondary | ICD-10-CM

## 2024-06-20 MED ORDER — FLUTICASONE-SALMETEROL 115-21 MCG/ACT IN AERO: 2.0000 | INHALATION_SPRAY | Freq: Two times a day (BID) | RESPIRATORY_TRACT | 6 refills | Status: DC

## 2024-06-20 MED ORDER — AEROCHAMBER MV MISC: 0 refills | Status: AC

## 2024-06-20 NOTE — Patient Instructions (Addendum)
 Dear Mr. James Gutierrez,  Based on your symptoms, it is likely you have asthma and COPD. You have constant wheezing for a while, so I will recommend to start an inhaler long term, called Advair, 2 puffs twice a day. Please rinse your mouth every time you use it.   I will check a pulmonary function test as well, and I send a referral to the lung cancer screening program, given your smoking history.   I will see you in 4 months.  INHALER INSTRUCTIONS: To use the inhaler you follow these steps: Prime the inhaler according to instructions (which means waste between 1-4 doses before next use).  Follow package insert Shake the inhaler before each use Connect spacer Exhale completely by blowing all the air out of your lungs Seal your mouth around the spacer Press down on the canister then inhale SLOW and STEADY until your fill your lungs completely. Hold the breath for 6-10 seconds Gently exhale Wait 60 seconds then repeat steps 2-8. Rinse the mouth after each use to prevent thrush  Your pharmacist can also review proper technique if you have any remaining questions. Let me know if the cost is too high, your insurance may be able to recommend a more affordable option for you.

## 2024-06-23 ENCOUNTER — Telehealth: Payer: Self-pay

## 2024-06-23 DIAGNOSIS — Z87891 Personal history of nicotine dependence: Secondary | ICD-10-CM

## 2024-06-23 DIAGNOSIS — Z122 Encounter for screening for malignant neoplasm of respiratory organs: Secondary | ICD-10-CM

## 2024-06-23 NOTE — Telephone Encounter (Signed)
 Lung Cancer Screening Narrative/Criteria Questionnaire (Cigarette Smokers Only- No Cigars/Pipes/vapes)   James Gutierrez   SDMV:06/30/2024 at 11:30 am [name]         02-09-1956               LDCT: 07/03/2024 at     68 y.o.   Phone: [phone]  Lung Screening Narrative (confirm age 52-77 yrs Medicare / 50-80 yrs Private pay insurance)   Insurance information:[employer]   Referring Provider: Optometrist, [place]   This screening involves an initial phone call with a team member from our program. It is called a shared decision making visit. The initial meeting is required by  insurance and Medicare to make sure you understand the program. This appointment takes about 15-20 minutes to complete. You will complete the screening scan at your scheduled date/time.  This scan takes about 5-10 minutes to complete. You can eat and drink normally before and after the scan.  Criteria questions for Lung Cancer Screening:   Are you a current or former smoker? Former Age began smoking: 17   If you are a former smoker, what year did you quit smoking? Quit 2020 (within 15 yrs)   To calculate your smoking history, I need an accurate estimate of how many packs of cigarettes you smoked per day and for how many years. (Not just the number of PPD you are now smoking)   Years smoking 46 x Packs per day 1.5 = Pack years 44.5   (at least 20 pack yrs)   (Make sure they understand that we need to know how much they have smoked in the past, not just the number of PPD they are smoking now)  Do you have a personal history of cancer?  Yes - (type and when diagnosed - 5 yrs cancer free) Prostate Cancer 2020    Do you have a family history of cancer? Yes  (cancer type and and relative) Brother with prostate cancer. Father had lung cancer.   Are you coughing up blood?  No  Have you had unexplained weight loss of 15 lbs or more in the last 6 months? No  It looks like you meet all criteria.  When would be a good time for us   to schedule you for this screening?   Additional information: N/A

## 2024-06-24 ENCOUNTER — Other Ambulatory Visit (HOSPITAL_COMMUNITY): Payer: Self-pay

## 2024-06-24 ENCOUNTER — Telehealth: Payer: Self-pay

## 2024-06-24 DIAGNOSIS — J449 Chronic obstructive pulmonary disease, unspecified: Secondary | ICD-10-CM

## 2024-06-24 MED ORDER — FLUTICASONE-SALMETEROL 250-50 MCG/ACT IN AEPB: 1.0000 | INHALATION_SPRAY | Freq: Two times a day (BID) | RESPIRATORY_TRACT | Status: DC

## 2024-06-24 NOTE — Telephone Encounter (Signed)
 Copied from CRM #[id number]. Topic: Clinical - Prescription Issue >> Jun 24, 2024  1:54 PM [name] wrote: Reason for CRM: Patient states his pharmacy has not received his inhaler prescriptions from Dr. Adrien as he is requesting someone to please help him resolve this.  Called pharmacy,looks like prior shara is needed for both advair and the spacer will send to prior auth team

## 2024-06-25 NOTE — Telephone Encounter (Signed)
 Patient Is aware.NFN

## 2024-06-27 MED ORDER — FLUTICASONE-SALMETEROL 250-50 MCG/ACT IN AEPB: 1.0000 | INHALATION_SPRAY | Freq: Two times a day (BID) | RESPIRATORY_TRACT | 11 refills | Status: DC

## 2024-06-27 NOTE — Telephone Encounter (Signed)
 Copied from CRM #[id number]. Topic: Clinical - Medication Prior Auth >> Jun 27, 2024  9:42 AM Devaughn RAMAN wrote: Reason for CRM: Patient called regarding fluticasone-salmeterol (ADVAIR) 250-50 MCG/ACT inhaler 1 puff medication. Pt stated the medication was sent in but it was not approved by his insurance as they are awaiting an authorization from [employer].  Called and spoke with the pt and notified him of Advair pricing. Pt would like to proceed with inhaler.  Previous inhaler sent on 10/21 was sent incorrectly so pt has been unable to pickup.  New inhaler has been sent to pt preferred pharmacy and pt is aware.  Nfn

## 2024-06-30 ENCOUNTER — Ambulatory Visit: Payer: PRIVATE HEALTH INSURANCE | Admitting: *Deleted

## 2024-06-30 ENCOUNTER — Encounter: Payer: Self-pay | Admitting: *Deleted

## 2024-06-30 DIAGNOSIS — Z87891 Personal history of nicotine dependence: Secondary | ICD-10-CM | POA: Diagnosis not present

## 2024-06-30 NOTE — Patient Instructions (Signed)

## 2024-06-30 NOTE — Progress Notes (Signed)
 Virtual Visit via Telephone Note  I connected with James Gutierrez on 06/30/24 at 11:30 AM EDT by telephone and verified that I am speaking with the correct person using two identifiers.  Location: Patient: at home Provider: [address], [place], [place], [address]    I discussed the limitations, risks, security and privacy concerns of performing an evaluation and management service by telephone and the availability of in person appointments. I also discussed with the patient that there may be a patient responsible charge related to this service. The patient expressed understanding and agreed to proceed.    Shared Decision Making Visit Lung Cancer Screening Program [phone])   Eligibility: Age 68 y.o. Pack Years Smoking History Calculation 45 (# packs/per year x # years smoked) Recent History of coughing up blood  no Unexplained weight loss? no ( >Than 15 pounds within the last 6 months ) Prior History Lung / other cancer no (Diagnosis within the last 5 years already requiring surveillance chest CT Scans). Smoking Status Former Smoker Former Smokers: Years since quit: 5 years  Quit Date: 09/2018  Visit Components: Discussion included one or more decision making aids. yes Discussion included risk/benefits of screening. yes Discussion included potential follow up diagnostic testing for abnormal scans. yes Discussion included meaning and risk of over diagnosis. yes Discussion included meaning and risk of False Positives. yes Discussion included meaning of total radiation exposure. yes  Counseling Included: Importance of adherence to annual lung cancer LDCT screening. yes Impact of comorbidities on ability to participate in the program. yes Ability and willingness to under diagnostic treatment. yes  Smoking Cessation Counseling: Current Smokers:  Discussed importance of smoking cessation. yes Information about tobacco cessation classes and interventions provided to  patient. yes Patient provided with ticket for LDCT Scan. no Symptomatic Patient.no   Diagnosis Code: Tobacco Use Z72.0 Asymptomatic Patient yes  Counseled patient 4 minutes regarding tobacco use.   Former Smokers:  Discussed the importance of maintaining cigarette abstinence. yes Diagnosis Code: Personal History of Nicotine Dependence. S12.108 Information about tobacco cessation classes and interventions provided to patient. Yes Patient provided with ticket for LDCT Scan. no Written Order for Lung Cancer Screening with LDCT placed in Epic. Yes (CT Chest Lung Cancer Screening Low Dose W/O CM) [id number] Z12.2-Screening of respiratory organs Z87.891-Personal history of nicotine dependence   [name] Speaks, RN

## 2024-07-03 ENCOUNTER — Ambulatory Visit
Admission: RE | Admit: 2024-07-03 | Discharge: 2024-07-03 | Disposition: A | Discharge status: Home / Self Care | Payer: PRIVATE HEALTH INSURANCE | Source: Ambulatory Visit | Attending: Acute Care | Admitting: Acute Care

## 2024-07-03 DIAGNOSIS — Z87891 Personal history of nicotine dependence: Secondary | ICD-10-CM

## 2024-07-03 DIAGNOSIS — Z122 Encounter for screening for malignant neoplasm of respiratory organs: Secondary | ICD-10-CM

## 2024-07-10 ENCOUNTER — Other Ambulatory Visit: Payer: Self-pay

## 2024-07-10 DIAGNOSIS — Z87891 Personal history of nicotine dependence: Secondary | ICD-10-CM

## 2024-07-10 DIAGNOSIS — Z122 Encounter for screening for malignant neoplasm of respiratory organs: Secondary | ICD-10-CM

## 2024-10-01 ENCOUNTER — Other Ambulatory Visit: Payer: Self-pay

## 2024-10-01 ENCOUNTER — Other Ambulatory Visit (HOSPITAL_COMMUNITY): Payer: Self-pay

## 2024-10-01 ENCOUNTER — Other Ambulatory Visit (HOSPITAL_BASED_OUTPATIENT_CLINIC_OR_DEPARTMENT_OTHER): Payer: Self-pay

## 2024-10-01 ENCOUNTER — Telehealth: Payer: Self-pay

## 2024-10-01 MED ORDER — FLUTICASONE-SALMETEROL 250-50 MCG/ACT IN AEPB
1.0000 | INHALATION_SPRAY | Freq: Two times a day (BID) | RESPIRATORY_TRACT | 6 refills | Status: AC
  Filled 2024-10-01: qty 60, 30d supply, fill #0

## 2024-10-01 NOTE — Telephone Encounter (Signed)
 Copied from CRM [id number]. Topic: Clinical - Medication Question >> Oct 01, 2024 12:22 PM Dedra B wrote: Reason for CRM: Patient wants to know if he needs to keep taking fluticasone -salmeterol (ADVAIR  DISKUS) 250-50 MCG/ACT AEPB until his upcoming appointment. He said it's expensive and would like something more affordable if possible. Please call patient.

## 2024-10-01 NOTE — Telephone Encounter (Signed)
 Reached out to pharmacy requested Rx to be canceled, should be able run benefits

## 2024-10-03 ENCOUNTER — Other Ambulatory Visit (HOSPITAL_COMMUNITY): Payer: Self-pay

## 2024-10-03 ENCOUNTER — Telehealth: Payer: Self-pay

## 2024-10-03 NOTE — Telephone Encounter (Signed)
 Spoke with patient he is going to reach out to pharmacy and request the Rx to be canceled and contact his insurance

## 2024-10-03 NOTE — Telephone Encounter (Signed)
 I called James Gutierrez, he report he is feeling well. I change the inhaler to wixela medium ICS, and hopefully his insurance cover it. I recommend to get the names if the inhalers that the insurance cover it to order those ones. Unfortunately the clinic was not able to obtain the information.  He appreciates the call.  Marny Patch, MD

## 2024-10-21 ENCOUNTER — Ambulatory Visit: Payer: PRIVATE HEALTH INSURANCE
